# Patient Record
Sex: Female | Born: 1935 | Race: White | Hispanic: No | Smoking: Former smoker
Health system: Southern US, Community
[De-identification: ages and names within clinical notes are randomized; demographics above are authoritative.]

## PROBLEM LIST (undated history)

## (undated) DIAGNOSIS — I10 Essential (primary) hypertension: Secondary | ICD-10-CM

## (undated) DIAGNOSIS — K3184 Gastroparesis: Secondary | ICD-10-CM

## (undated) DIAGNOSIS — C50919 Malignant neoplasm of unspecified site of unspecified female breast: Secondary | ICD-10-CM

## (undated) DIAGNOSIS — J449 Chronic obstructive pulmonary disease, unspecified: Secondary | ICD-10-CM

## (undated) DIAGNOSIS — R7303 Prediabetes: Secondary | ICD-10-CM

## (undated) DIAGNOSIS — K219 Gastro-esophageal reflux disease without esophagitis: Secondary | ICD-10-CM

## (undated) DIAGNOSIS — G459 Transient cerebral ischemic attack, unspecified: Secondary | ICD-10-CM

## (undated) DIAGNOSIS — Z9889 Other specified postprocedural states: Secondary | ICD-10-CM

## (undated) DIAGNOSIS — N200 Calculus of kidney: Secondary | ICD-10-CM

## (undated) DIAGNOSIS — Z8719 Personal history of other diseases of the digestive system: Secondary | ICD-10-CM

## (undated) DIAGNOSIS — Z87898 Personal history of other specified conditions: Secondary | ICD-10-CM

## (undated) DIAGNOSIS — M1990 Unspecified osteoarthritis, unspecified site: Secondary | ICD-10-CM

## (undated) DIAGNOSIS — C801 Malignant (primary) neoplasm, unspecified: Secondary | ICD-10-CM

## (undated) DIAGNOSIS — M722 Plantar fascial fibromatosis: Secondary | ICD-10-CM

## (undated) DIAGNOSIS — R42 Dizziness and giddiness: Secondary | ICD-10-CM

## (undated) DIAGNOSIS — E7800 Pure hypercholesterolemia, unspecified: Secondary | ICD-10-CM

## (undated) DIAGNOSIS — K649 Unspecified hemorrhoids: Secondary | ICD-10-CM

## (undated) DIAGNOSIS — E559 Vitamin D deficiency, unspecified: Secondary | ICD-10-CM

## (undated) DIAGNOSIS — F419 Anxiety disorder, unspecified: Secondary | ICD-10-CM

## (undated) DIAGNOSIS — J439 Emphysema, unspecified: Secondary | ICD-10-CM

## (undated) DIAGNOSIS — R112 Nausea with vomiting, unspecified: Secondary | ICD-10-CM

## (undated) DIAGNOSIS — Z87442 Personal history of urinary calculi: Secondary | ICD-10-CM

## (undated) HISTORY — PX: COLONOSCOPY: SHX174

## (undated) HISTORY — PX: UPPER GI ENDOSCOPY: SHX6162

## (undated) HISTORY — DX: Calculus of kidney: N20.0

## (undated) HISTORY — DX: Other specified postprocedural states: Z98.890

## (undated) HISTORY — PX: ANTERIOR FUSION CERVICAL SPINE: SUR626

## (undated) HISTORY — DX: Anxiety disorder, unspecified: F41.9

## (undated) HISTORY — DX: Dizziness and giddiness: R42

## (undated) HISTORY — PX: BREAST SURGERY: SHX581

## (undated) HISTORY — DX: Unspecified hemorrhoids: K64.9

## (undated) HISTORY — PX: ABDOMINAL HYSTERECTOMY: SHX81

## (undated) HISTORY — DX: Prediabetes: R73.03

## (undated) HISTORY — DX: Essential (primary) hypertension: I10

## (undated) HISTORY — DX: Plantar fascial fibromatosis: M72.2

## (undated) HISTORY — PX: CHOLECYSTECTOMY: SHX55

## (undated) HISTORY — DX: Other specified postprocedural states: R11.2

## (undated) HISTORY — PX: HERNIA REPAIR: SHX51

## (undated) HISTORY — DX: Vitamin D deficiency, unspecified: E55.9

## (undated) HISTORY — DX: Pure hypercholesterolemia, unspecified: E78.00

## (undated) HISTORY — DX: Gastroparesis: K31.84

## (undated) HISTORY — DX: Personal history of other specified conditions: Z87.898

## (undated) HISTORY — PX: NISSEN FUNDOPLICATION: SHX2091

---

## 1974-03-19 HISTORY — PX: TOTAL ABDOMINAL HYSTERECTOMY W/ BILATERAL SALPINGOOPHORECTOMY: SHX83

## 1998-03-19 HISTORY — PX: POLYPECTOMY: SHX149

## 1999-03-20 HISTORY — PX: BREAST EXCISIONAL BIOPSY: SUR124

## 2004-03-19 DIAGNOSIS — C50919 Malignant neoplasm of unspecified site of unspecified female breast: Secondary | ICD-10-CM

## 2004-03-19 HISTORY — PX: BREAST EXCISIONAL BIOPSY: SUR124

## 2004-03-19 HISTORY — DX: Malignant neoplasm of unspecified site of unspecified female breast: C50.919

## 2004-03-28 ENCOUNTER — Ambulatory Visit: Payer: Self-pay | Admitting: Internal Medicine

## 2004-03-30 ENCOUNTER — Ambulatory Visit: Payer: Self-pay | Admitting: Internal Medicine

## 2004-04-11 ENCOUNTER — Ambulatory Visit: Payer: Self-pay | Admitting: Surgery

## 2004-04-17 ENCOUNTER — Ambulatory Visit: Payer: Self-pay | Admitting: Surgery

## 2004-04-27 ENCOUNTER — Ambulatory Visit: Payer: Self-pay | Admitting: Surgery

## 2004-05-15 ENCOUNTER — Ambulatory Visit: Payer: Self-pay | Admitting: Internal Medicine

## 2004-05-22 ENCOUNTER — Ambulatory Visit: Payer: Self-pay | Admitting: Internal Medicine

## 2004-06-17 ENCOUNTER — Ambulatory Visit: Payer: Self-pay | Admitting: Internal Medicine

## 2004-07-17 ENCOUNTER — Ambulatory Visit: Payer: Self-pay | Admitting: Internal Medicine

## 2004-08-17 ENCOUNTER — Ambulatory Visit: Payer: Self-pay | Admitting: Internal Medicine

## 2004-10-12 ENCOUNTER — Ambulatory Visit: Payer: Self-pay | Admitting: Internal Medicine

## 2004-10-17 ENCOUNTER — Ambulatory Visit: Payer: Self-pay | Admitting: Internal Medicine

## 2004-11-28 ENCOUNTER — Ambulatory Visit: Payer: Self-pay | Admitting: Internal Medicine

## 2004-12-21 ENCOUNTER — Ambulatory Visit: Payer: Self-pay | Admitting: Radiation Oncology

## 2005-02-14 ENCOUNTER — Ambulatory Visit: Payer: Self-pay | Admitting: Internal Medicine

## 2005-02-16 ENCOUNTER — Ambulatory Visit: Payer: Self-pay | Admitting: Internal Medicine

## 2005-04-02 ENCOUNTER — Ambulatory Visit: Payer: Self-pay | Admitting: Internal Medicine

## 2005-06-21 ENCOUNTER — Ambulatory Visit: Payer: Self-pay | Admitting: Radiation Oncology

## 2005-08-28 ENCOUNTER — Ambulatory Visit: Payer: Self-pay | Admitting: Internal Medicine

## 2005-09-16 ENCOUNTER — Ambulatory Visit: Payer: Self-pay | Admitting: Internal Medicine

## 2005-11-17 ENCOUNTER — Ambulatory Visit: Payer: Self-pay | Admitting: Internal Medicine

## 2006-02-27 ENCOUNTER — Ambulatory Visit: Payer: Self-pay | Admitting: Internal Medicine

## 2006-03-05 ENCOUNTER — Ambulatory Visit: Payer: Self-pay | Admitting: Internal Medicine

## 2006-03-07 ENCOUNTER — Other Ambulatory Visit: Payer: Self-pay

## 2006-03-07 ENCOUNTER — Emergency Department: Payer: Self-pay | Admitting: Emergency Medicine

## 2006-03-10 ENCOUNTER — Emergency Department: Payer: Self-pay | Admitting: General Practice

## 2006-03-10 ENCOUNTER — Other Ambulatory Visit: Payer: Self-pay

## 2006-03-14 ENCOUNTER — Inpatient Hospital Stay: Payer: Self-pay | Admitting: Surgery

## 2006-03-19 ENCOUNTER — Ambulatory Visit: Payer: Self-pay | Admitting: Internal Medicine

## 2006-04-03 ENCOUNTER — Ambulatory Visit: Payer: Self-pay | Admitting: Internal Medicine

## 2006-06-21 ENCOUNTER — Ambulatory Visit: Payer: Self-pay | Admitting: Radiation Oncology

## 2006-07-18 ENCOUNTER — Ambulatory Visit: Payer: Self-pay | Admitting: Radiation Oncology

## 2006-08-18 ENCOUNTER — Ambulatory Visit: Payer: Self-pay | Admitting: Internal Medicine

## 2006-09-09 ENCOUNTER — Ambulatory Visit: Payer: Self-pay | Admitting: Internal Medicine

## 2006-09-12 ENCOUNTER — Ambulatory Visit: Payer: Self-pay | Admitting: Internal Medicine

## 2006-09-17 ENCOUNTER — Ambulatory Visit: Payer: Self-pay | Admitting: Internal Medicine

## 2006-12-18 ENCOUNTER — Ambulatory Visit: Payer: Self-pay | Admitting: Internal Medicine

## 2006-12-18 ENCOUNTER — Ambulatory Visit: Payer: Self-pay | Admitting: Radiation Oncology

## 2007-01-18 ENCOUNTER — Ambulatory Visit: Payer: Self-pay | Admitting: Radiation Oncology

## 2007-02-17 ENCOUNTER — Ambulatory Visit: Payer: Self-pay | Admitting: Internal Medicine

## 2007-03-04 ENCOUNTER — Ambulatory Visit: Payer: Self-pay | Admitting: Internal Medicine

## 2007-03-20 ENCOUNTER — Ambulatory Visit: Payer: Self-pay | Admitting: Internal Medicine

## 2007-04-07 ENCOUNTER — Ambulatory Visit: Payer: Self-pay | Admitting: Internal Medicine

## 2007-06-15 ENCOUNTER — Other Ambulatory Visit: Payer: Self-pay

## 2007-06-16 ENCOUNTER — Observation Stay: Payer: Self-pay | Admitting: Internal Medicine

## 2007-08-18 ENCOUNTER — Ambulatory Visit: Payer: Self-pay | Admitting: Internal Medicine

## 2007-09-02 ENCOUNTER — Ambulatory Visit: Payer: Self-pay | Admitting: Internal Medicine

## 2007-09-17 ENCOUNTER — Ambulatory Visit: Payer: Self-pay | Admitting: Internal Medicine

## 2007-11-13 ENCOUNTER — Ambulatory Visit: Payer: Self-pay | Admitting: Internal Medicine

## 2007-11-18 ENCOUNTER — Ambulatory Visit: Payer: Self-pay | Admitting: Radiation Oncology

## 2007-11-19 ENCOUNTER — Ambulatory Visit: Payer: Self-pay | Admitting: Internal Medicine

## 2007-12-17 ENCOUNTER — Ambulatory Visit: Payer: Self-pay | Admitting: Internal Medicine

## 2008-02-17 ENCOUNTER — Ambulatory Visit: Payer: Self-pay | Admitting: Internal Medicine

## 2008-03-01 ENCOUNTER — Ambulatory Visit: Payer: Self-pay | Admitting: Internal Medicine

## 2008-03-19 ENCOUNTER — Ambulatory Visit: Payer: Self-pay | Admitting: Internal Medicine

## 2008-05-03 ENCOUNTER — Ambulatory Visit: Payer: Self-pay | Admitting: Surgery

## 2008-09-16 ENCOUNTER — Ambulatory Visit: Payer: Self-pay | Admitting: Internal Medicine

## 2008-09-24 ENCOUNTER — Ambulatory Visit: Payer: Self-pay | Admitting: Internal Medicine

## 2008-10-17 ENCOUNTER — Ambulatory Visit: Payer: Self-pay | Admitting: Internal Medicine

## 2008-11-17 ENCOUNTER — Ambulatory Visit: Payer: Self-pay | Admitting: Internal Medicine

## 2009-02-24 ENCOUNTER — Ambulatory Visit: Payer: Self-pay | Admitting: Gastroenterology

## 2009-04-11 ENCOUNTER — Ambulatory Visit: Payer: Self-pay | Admitting: Gastroenterology

## 2009-05-05 ENCOUNTER — Ambulatory Visit: Payer: Self-pay | Admitting: Internal Medicine

## 2009-07-17 ENCOUNTER — Ambulatory Visit: Payer: Self-pay | Admitting: Internal Medicine

## 2009-07-22 ENCOUNTER — Ambulatory Visit: Payer: Self-pay | Admitting: Internal Medicine

## 2009-08-17 ENCOUNTER — Ambulatory Visit: Payer: Self-pay | Admitting: Internal Medicine

## 2010-04-24 ENCOUNTER — Ambulatory Visit: Payer: Self-pay | Admitting: Internal Medicine

## 2010-05-08 ENCOUNTER — Ambulatory Visit: Payer: Self-pay | Admitting: Internal Medicine

## 2010-07-26 ENCOUNTER — Ambulatory Visit: Payer: Self-pay | Admitting: Internal Medicine

## 2010-07-27 LAB — CANCER ANTIGEN 27.29: CA 27.29: 15.9 U/mL (ref 0.0–38.6)

## 2010-08-18 ENCOUNTER — Ambulatory Visit: Payer: Self-pay | Admitting: Internal Medicine

## 2010-11-10 ENCOUNTER — Ambulatory Visit: Payer: Self-pay | Admitting: Otolaryngology

## 2011-03-10 ENCOUNTER — Emergency Department: Payer: Self-pay | Admitting: *Deleted

## 2011-05-10 ENCOUNTER — Ambulatory Visit: Payer: Self-pay | Admitting: Internal Medicine

## 2011-06-04 ENCOUNTER — Ambulatory Visit: Payer: Self-pay | Admitting: Family Medicine

## 2011-06-04 LAB — CREATININE, SERUM: EGFR (Non-African Amer.): >60

## 2011-09-12 ENCOUNTER — Emergency Department: Payer: Self-pay | Admitting: Internal Medicine

## 2011-09-12 LAB — COMPREHENSIVE METABOLIC PANEL
Albumin: 3.6 g/dL (ref 3.4–5.0)
Alkaline Phosphatase: 66 U/L (ref 50–136)
BUN: 19 mg/dL — ABNORMAL HIGH (ref 7–18)
Bilirubin,Total: 0.4 mg/dL (ref 0.2–1.0)
Chloride: 102 mmol/L (ref 98–107)
Co2: 30 mmol/L (ref 21–32)
Creatinine: 0.99 mg/dL (ref 0.60–1.30)
EGFR (African American): >60
EGFR (Non-African Amer.): 55 — ABNORMAL LOW
Glucose: 143 mg/dL — ABNORMAL HIGH (ref 65–99)
Potassium: 3.1 mmol/L — ABNORMAL LOW (ref 3.5–5.1)
SGOT(AST): 20 U/L (ref 15–37)
SGPT (ALT): 23 U/L
Total Protein: 6.5 g/dL (ref 6.4–8.2)

## 2011-09-12 LAB — CBC
HCT: 42 % (ref 35.0–47.0)
MCH: 30.7 pg (ref 26.0–34.0)
MCHC: 33.5 g/dL (ref 32.0–36.0)
MCV: 91 fL (ref 80–100)
Platelet: 230 10*3/uL (ref 150–440)
RBC: 4.59 10*6/uL (ref 3.80–5.20)
WBC: 8.9 10*3/uL (ref 3.6–11.0)

## 2011-09-12 LAB — URINALYSIS, COMPLETE
Bilirubin,UR: NEGATIVE
Ketone: NEGATIVE
Nitrite: NEGATIVE
Ph: 6 (ref 4.5–8.0)
Protein: NEGATIVE
WBC UR: 1 /HPF (ref 0–5)

## 2012-02-08 ENCOUNTER — Ambulatory Visit: Payer: Self-pay | Admitting: Family Medicine

## 2012-03-06 ENCOUNTER — Ambulatory Visit: Payer: Self-pay | Admitting: Family Medicine

## 2012-03-19 HISTORY — PX: HIATAL HERNIA REPAIR: SHX195

## 2012-04-01 ENCOUNTER — Ambulatory Visit: Payer: Self-pay | Admitting: Gastroenterology

## 2012-04-11 ENCOUNTER — Ambulatory Visit: Payer: Self-pay | Admitting: Gastroenterology

## 2012-04-11 HISTORY — PX: UPPER GASTROINTESTINAL ENDOSCOPY: SHX188

## 2012-04-30 ENCOUNTER — Ambulatory Visit: Payer: Self-pay | Admitting: Surgery

## 2012-05-08 ENCOUNTER — Inpatient Hospital Stay: Payer: Self-pay | Admitting: Surgery

## 2012-05-13 LAB — PLATELET COUNT: Platelet: 200 10*3/uL (ref 150–440)

## 2012-05-22 ENCOUNTER — Ambulatory Visit: Payer: Self-pay

## 2012-05-28 ENCOUNTER — Ambulatory Visit: Payer: Self-pay | Admitting: Internal Medicine

## 2012-06-13 ENCOUNTER — Ambulatory Visit: Payer: Self-pay | Admitting: Surgery

## 2012-07-17 ENCOUNTER — Ambulatory Visit: Payer: Self-pay | Admitting: Gastroenterology

## 2012-07-25 ENCOUNTER — Ambulatory Visit: Payer: Self-pay | Admitting: Gastroenterology

## 2012-07-25 HISTORY — PX: UPPER GASTROINTESTINAL ENDOSCOPY: SHX188

## 2012-08-13 ENCOUNTER — Ambulatory Visit: Payer: Self-pay | Admitting: Gastroenterology

## 2012-12-18 ENCOUNTER — Ambulatory Visit: Payer: Self-pay | Admitting: Gastroenterology

## 2012-12-19 LAB — PATHOLOGY REPORT

## 2013-06-01 ENCOUNTER — Ambulatory Visit: Payer: Self-pay | Admitting: Family Medicine

## 2014-03-02 HISTORY — PX: OTHER SURGICAL HISTORY: SHX169

## 2014-03-02 HISTORY — PX: EYE SURGERY: SHX253

## 2014-03-08 HISTORY — PX: OTHER SURGICAL HISTORY: SHX169

## 2014-03-29 HISTORY — PX: INTRAOCULAR LENS EXCHANGE: SHX1841

## 2014-05-17 ENCOUNTER — Other Ambulatory Visit: Payer: Self-pay | Admitting: Gastroenterology

## 2014-06-16 ENCOUNTER — Ambulatory Visit
Admit: 2014-06-16 | Disposition: A | Discharge status: Home / Self Care | Payer: Self-pay | Attending: Family Medicine | Admitting: Family Medicine

## 2014-07-09 NOTE — H&P (Signed)
PATIENT NAME:  Yolanda Taylor, Yolanda Taylor MR#:  [id number] DATE OF BIRTH:  18-Dec-1930  DATE OF ADMISSION:  07/25/2012  REASON FOR ADMISSION:  Gastric bezoar status post prolonged sedated procedure.   HISTORY OF PRESENT ILLNESS:  The patient is a 81 year old Caucasian female who came into the outpatient endoscopy unit for EGD. She was set up for this procedure due to the finding of what was possibly a gastric bezoar. She has a history of having an intrathoracic stomach with volvulus, putting her at risk for strangulation of the organ. She underwent recent fundoplication, that being in about mid February 2014, to correct this situation. At that time, she was also having multiple problems of dysphagia, gastroesophageal reflux and atypical chest pain. Since the fundoplication had been done, she seemed to be doing well for about a month, then began having multiple GI complaints. She was placed on some metoclopramide and some MiraLAX by her surgeon; however, she complained that this combination gave her diarrhea, and she stopped taking it.  She had an x-ray done at Dr. Thompson Caul office several weeks ago with the finding of possibly retaining gastric content. She was then sent back to see me. I instituted several measures at that time to include low residue diet, 6 smaller meals a day, MiraLAX on a regular basis, Phazyme, proton pump inhibitor. No carbonated beverages. A three-way abdominal film done about a week later showed apparently no evidence of the bezoar; however, her symptoms have increased and continued. Subsequently, she was brought in today to have her EGD. This procedure was done with general anesthesia to protect the airway due to the many passes of the instrument up and down the esophagus needed to clear the obstruction in the stomach. This was cleared after a prolonged procedure. She was also noted to have erosive esophagitis and esophageal dysmotility with marked tortuosity/presbyesophagus. She tolerated the  procedure well, currently is in postoperative recovery. She is being admitted for overnight observation.   PAST MEDICAL HISTORY: 1.  Hypertension.  2.  Hyperlipidemia.  3.  Gastroesophageal reflux.  4.  History of right breast cancer status post lumpectomy and radiation treatment followed by Dr. Ma Hillock and Dr. Tamala Julian.  5.  Anxiety.  6.  Vitamin D deficiency.  7.  History of vertigo.  8.  Chronic obstructive pulmonary disease.   PAST SURGICAL HISTORY:  1.  Cervical spinal fusion in 1969.  2.  Hysterectomy, bilateral oophorectomy in 1976.  3.  Colon polyp removal, 2000.  4.  Cholecystectomy in December 2007.  5.  Right breast cancer surgery.   OUTPATIENT MEDICATIONS: Include hydrochlorothiazide 25 mg a day, Advair Diskus 250/50 mcg 1 puff twice a day, Flonase 2 sprays once a day p.r.n., calcium with vitamin D, Ultram 50 mg 1 to 2 tablets every 4 to 6 hours p.r.n. for pain, Tylenol Extra Strength tablet p.r.n., buspirone 5 mg 1/2 to 1 tablet t.i.d. as needed, ondansetron 4 mg 3 times a day and rabeprazole 20 mg once a day before meals.   ALLERGIES: SHE IS ALLERGIC TO PAXIL, THIS MAKING HER "[place]."   PHYSICAL EXAMINATION: VITAL SIGNS: Temperature is 97, respirations 14, blood pressure 126/61, heart rate 82, pulse ox 100% on 2 liter nasal cannula.  HEENT: Normocephalic, atraumatic.  Eyes are anicteric.  HEART: Regular rate and rhythm.  LUNGS: Clear.  ABDOMEN: Soft. There is some minimal tenderness to palpation in the upper epigastric region. Bowel sounds positive, normoactive. There is no apparent organomegaly or masses felt.  ANORECTAL: Deferred.  EXTREMITIES:  No clubbing, cyanosis or edema.  NEUROLOGICAL: Cranial nerves II through XII grossly intact. Muscle strength bilaterally equal and symmetric. DTRs bilaterally equal and symmetric. She is in no acute distress.   ASSESSMENT:  1.  Gastric bezoar, now status post endoscopy and removal of this obstruction in the stomach. The procedure  was somewhat prolonged, and she is being admitted for observation overnight. We will continue her outpatient medications.  We will initiate Reglan with meals, and continue her on a clear liquid diet for now. She will be discharged on a full liquid diet to follow up with me this coming week.    ____________________________ Lollie Sails, MD mus:dmm D: 07/25/2012 18:31:01 ET T: 07/25/2012 19:08:42 ET JOB#: [id number]  cc: Lollie Sails, MD, <Dictator> Lollie Sails MD ELECTRONICALLY SIGNED 07/29/2012 13:03

## 2014-07-09 NOTE — Op Note (Signed)
PATIENT NAME:  Yolanda Taylor, GRUSSING MR#:  [id number] DATE OF BIRTH:  [birth year]  DATE OF PROCEDURE:  05/08/2012  PREOPERATIVE DIAGNOSIS: Hiatus hernia.   POSTOPERATIVE DIAGNOSIS: Hiatus hernia.   PROCEDURE: Laparoscopic hiatus hernia repair with fundoplication.   SURGEON: Rochel Brome, MD.   ANESTHESIA: General.   INDICATIONS: This 81 year old female has a history of epigastric bloating discomfort going on for several years, recently of increased severity. Recently had a barium swallow that demonstrated a hiatus hernia. CT demonstrated intrathoracic stomach. Upper endoscopy with evidence of torsion, and surgery was recommended for definitive treatment.   DESCRIPTION OF PROCEDURE: The patient was placed on the operating table in the supine position under general endotracheal anesthesia. The abdomen was prepared with ChloraPrep and draped in a sterile manner.   A supraumbilical incision was made and dissected down to the deep fascia which was grasped with laryngeal hook and elevated. A Veress needle was inserted, aspirated and irrigated with a saline solution. Next, the peritoneal cavity was inflated with carbon dioxide. The Veress needle was removed. The 10 mm cannula was inserted. The 10 mm 30-degree laparoscope was inserted to view the peritoneal cavity. The liver appeared smooth. It did have prominence of the left lobe extending far over to the left side. Brief survey demonstrated omentum and some small bowel and was otherwise unremarkable. The patient was placed in the reverse Trendelenburg position. A subxiphoid incision was made to insert an 11 mm cannula. Another incision was made in the left upper quadrant at the costal margin at the anterior axillary line to insert another 11 mm cannula. Additional cannulas were inserted in the right upper quadrant at the midclavicular line and one midway between that point and the camera site and another in the anterior axillary line for a total of 6  cannulas.   The fan retractor was placed through the subxiphoid cannula to retract the left lobe of the liver, exposing the diaphragmatic hiatus. The stomach was not yet seen at this time. There was omentum which protruded up into the hiatus, and traction was applied and some of the omentum was pulled down and exposed a portion of the stomach. Additional traction was applied. There was a small tear in the serosa of the stomach which was repaired with a 0 Surgidac figure-of-eight suture. Subsequently, with additional manipulation, the stomach was pulled down into the abdomen. Some dissection was carried out along the gastrohepatic ligament, and this was divided with the Harmonic scalpel and with some further manipulation with additional dissection, the stomach was eventually delivered down into the abdominal cavity. Circumferential dissection of the sac was done with the Harmonic scalpel, and a portion of the sac was excised and did not need to be sent for pathology. The right crus of the diaphragm was demonstrated and the peritoneum dissected away from it. Also, the left crus was identified and peritoneum dissected away from it, and also the sac was dissected circumferentially. With traction, the duodenum did come down below the diaphragm and extended several more centimeters to reach the stomach. Next, the repair was carried out with a row of 0 Surgidac figure-of-eight sutures beginning posteriorly and bringing this up to the esophagus, leaving just a small opening for satisfactory swallowing and this was sized with the 10 mm stone scoop. Next, the Gore-Tex Bio-A 7 x 10 cm mesh was selected which had a precut slot made for the esophagus. This slot was further cut to make it about 1 cm deeper and also corners were trimmed.  The mesh was inserted and was passed across the repair from right to left with the slot facing anterior and straddled this around the remaining hiatus and used a stapling instrument to attach  the mesh to the diaphragm bilaterally to secure it in place. The repair looked good. It is noted that during the course of the procedure there was minimal bleeding. A small amount of serosanguineous fluid was aspirated. Hemostasis subsequently appeared to be intact. Next, the fundoplication was carried out, passing a portion of fundus from left to right posterior to the esophagus and another portion brought adjacent to it, and the plication was carried out with interrupted 0 Surgidac simple sutures incorporating just a small amount of the seromuscular coat of the esophagus into the plication, and the plication appeared to be satisfactorily floppy. Following this, seeing hemostasis was intact, the laparoscopic instruments were removed. There was some scant bleeding from the incision in the right midclavicular line. This site was infiltrated with 0.5% Sensorcaine with epinephrine. Also, all incisions were injected in the subcutaneous tissues. Carbon dioxide was allowed to escape from the peritoneal cavity. Skin incisions were closed with interrupted 4-0 chromic subcuticular sutures and Dermabond. The patient tolerated surgery satisfactorily and then was carried to the recovery room for postoperative care.    ____________________________ J. Rochel Brome, MD jws:gb D: 05/08/2012 17:52:59 ET T: 05/09/2012 06:05:00 ET JOB#: [id number]  cc: Loreli Dollar, MD, <Dictator> Loreli Dollar MD ELECTRONICALLY SIGNED 05/10/2012 9:57

## 2014-07-09 NOTE — Discharge Summary (Signed)
PATIENT NAME:  Yolanda Taylor, Yolanda Taylor MR#:  [id number] DATE OF BIRTH:  Nov 02, 1930  DATE OF ADMISSION:  05/08/2012 DATE OF DISCHARGE:  05/13/2012  HISTORY OF PRESENT ILLNESS: This 81 year old female was admitted electively for laparoscopic hiatus hernia repair with fundoplication She had a history of epigastric bloating discomfort going on for years, recently increased in severity. Recently had a CT scan which demonstrated intrathoracic stomach. Upper endoscopy with evidence of torsion of the stomach with large hiatus hernia.   Other past medical history includes hypertension, COPD, anxiety, history of right breast cancer.   She was brought in through the outpatient department and had a laparoscopic hiatus hernia repair with Gore-Tex Bio-A mesh and also fundoplication.   Postoperatively, she was kept in the hospital for a period of observation. Did have some problems with nasal congestion and also just some difficulties with mild hypoxemia. Was treated with bronchodilators and Afrin spray. Had some mild degree of dysphagia. Gradually improved and was subsequently discharged.   DISCHARGE INSTRUCTIONS: Include followup in the office and will continue her usual medicines.   FINAL DIAGNOSIS: Hiatus hernia with chronic gastroesophageal reflux.   OPERATION: Laparoscopic hiatus hernia repair with fundoplication.   ____________________________ Lenna Sciara. Rochel Brome, MD jws:jm D: 05/23/2012 11:24:37 ET T: 05/23/2012 13:47:41 ET JOB#: [id number]  cc: Loreli Dollar, MD, <Dictator> Loreli Dollar MD ELECTRONICALLY SIGNED 05/24/2012 14:47

## 2014-07-09 NOTE — Consult Note (Signed)
Chief Complaint:  Subjective/Chief Complaint Please see admission h+p #[id number].  Patient admitted from outpatietn endoscopy for observation after EGD for removal of gastric bezoar.  Prolonged sedation was required to remove the bezoar, basically compacted food/vegetable material. Patietn did well after proceedure, will admit for observation, please see admission H+P.   Brief Assessment:  Cardiac Regular   Respiratory clear BS   Gastrointestinal details normal Soft  Nondistended  No masses palpable  Bowel sounds normal  No rebound tenderness  No gaurding  mild upper epigastric discojfort to palpation.   Electronic Signatures: Loistine Simas (MD)  (Signed [phone] 18:34)  Authored: Chief Complaint, Brief Assessment   Last Updated: 09-May-14 18:34 by Loistine Simas (MD)

## 2014-07-09 NOTE — Consult Note (Signed)
Chief Complaint:  Subjective/Chief Complaint Patietn requesting to go home.  Patietn reassessed, doing well after more fully awakening form sedation.  Patietn to go home with family assistance, to call if there is any nausea vomiting fever or abdominal pain.  to continue bid ppi (protonix) and reglan 5 mg 30 min ac and at bedtime.  (prescription given).  Patient to call my office on monday am to arrange appointment to see me this week.  Will d/c home.   VITAL SIGNS/ANCILLARY NOTES: **Vital Signs.:   09-May-14 19:21  Vital Signs Type Admission  Temperature Temperature (F) 97.8  Celsius 36.5  Temperature Source oral  Pulse Pulse 77  Respirations Respirations 18  Systolic BP Systolic BP 474  Diastolic BP (mmHg) Diastolic BP (mmHg) 63  Mean BP 88  Pulse Ox % Pulse Ox % 93  Pulse Ox Activity Level  At rest  Oxygen Delivery Room Air/ 21 %   Brief Assessment:  Cardiac Regular   Respiratory clear BS   Gastrointestinal details normal Soft  Nontender  Nondistended  No masses palpable  Bowel sounds normal  No rebound tenderness  No gaurding   Electronic Signatures: Loistine Simas (MD)  (Signed 09-May-14 19:39)  Authored: Chief Complaint, VITAL SIGNS/ANCILLARY NOTES, Brief Assessment   Last Updated: 09-May-14 19:39 by Loistine Simas (MD)

## 2014-09-17 HISTORY — PX: HIP FRACTURE SURGERY: SHX118

## 2014-10-10 ENCOUNTER — Emergency Department: Payer: Medicare Other

## 2014-10-10 ENCOUNTER — Encounter: Payer: Self-pay | Admitting: Emergency Medicine

## 2014-10-10 ENCOUNTER — Other Ambulatory Visit: Payer: Self-pay | Admitting: Emergency Medicine

## 2014-10-10 ENCOUNTER — Inpatient Hospital Stay
Admission: EM | Admit: 2014-10-10 | Discharge: 2014-10-14 | DRG: 481 | Disposition: A | Discharge status: Skilled Nursing Facility | Payer: Medicare Other | Attending: Internal Medicine | Admitting: Internal Medicine

## 2014-10-10 DIAGNOSIS — M25552 Pain in left hip: Secondary | ICD-10-CM

## 2014-10-10 DIAGNOSIS — J449 Chronic obstructive pulmonary disease, unspecified: Secondary | ICD-10-CM | POA: Diagnosis present

## 2014-10-10 DIAGNOSIS — Z79899 Other long term (current) drug therapy: Secondary | ICD-10-CM

## 2014-10-10 DIAGNOSIS — S7222XA Displaced subtrochanteric fracture of left femur, initial encounter for closed fracture: Secondary | ICD-10-CM | POA: Diagnosis not present

## 2014-10-10 DIAGNOSIS — Z7982 Long term (current) use of aspirin: Secondary | ICD-10-CM

## 2014-10-10 DIAGNOSIS — D62 Acute posthemorrhagic anemia: Secondary | ICD-10-CM | POA: Diagnosis not present

## 2014-10-10 DIAGNOSIS — Z853 Personal history of malignant neoplasm of breast: Secondary | ICD-10-CM

## 2014-10-10 DIAGNOSIS — S72413A Displaced unspecified condyle fracture of lower end of unspecified femur, initial encounter for closed fracture: Secondary | ICD-10-CM | POA: Diagnosis present

## 2014-10-10 DIAGNOSIS — S7290XA Unspecified fracture of unspecified femur, initial encounter for closed fracture: Secondary | ICD-10-CM

## 2014-10-10 DIAGNOSIS — I10 Essential (primary) hypertension: Secondary | ICD-10-CM | POA: Diagnosis present

## 2014-10-10 DIAGNOSIS — S7292XA Unspecified fracture of left femur, initial encounter for closed fracture: Secondary | ICD-10-CM

## 2014-10-10 DIAGNOSIS — T148XXA Other injury of unspecified body region, initial encounter: Secondary | ICD-10-CM

## 2014-10-10 DIAGNOSIS — R509 Fever, unspecified: Secondary | ICD-10-CM

## 2014-10-10 DIAGNOSIS — R5082 Postprocedural fever: Secondary | ICD-10-CM | POA: Diagnosis not present

## 2014-10-10 DIAGNOSIS — Y92002 Bathroom of unspecified non-institutional (private) residence single-family (private) house as the place of occurrence of the external cause: Secondary | ICD-10-CM

## 2014-10-10 DIAGNOSIS — W010XXA Fall on same level from slipping, tripping and stumbling without subsequent striking against object, initial encounter: Secondary | ICD-10-CM | POA: Diagnosis present

## 2014-10-10 HISTORY — DX: Malignant (primary) neoplasm, unspecified: C80.1

## 2014-10-10 HISTORY — DX: Essential (primary) hypertension: I10

## 2014-10-10 HISTORY — DX: Chronic obstructive pulmonary disease, unspecified: J44.9

## 2014-10-10 LAB — CBC WITH DIFFERENTIAL/PLATELET
BASOS PCT: 0 %
Basophils Absolute: 0 10*3/uL (ref 0–0.1)
Eosinophils Absolute: 0 10*3/uL (ref 0–0.7)
Eosinophils Relative: 0 %
HCT: 41.8 % (ref 35.0–47.0)
Hemoglobin: 13.8 g/dL (ref 12.0–16.0)
Lymphocytes Relative: 6 %
Lymphs Abs: 0.9 10*3/uL — ABNORMAL LOW (ref 1.0–3.6)
MCH: 30.5 pg (ref 26.0–34.0)
MCHC: 33 g/dL (ref 32.0–36.0)
MCV: 92.4 fL (ref 80.0–100.0)
MONOS PCT: 5 %
Monocytes Absolute: 0.7 10*3/uL (ref 0.2–0.9)
NEUTROS ABS: 13.5 10*3/uL — AB (ref 1.4–6.5)
NEUTROS PCT: 89 %
Platelets: 231 10*3/uL (ref 150–440)
RBC: 4.52 MIL/uL (ref 3.80–5.20)
RDW: 13 % (ref 11.5–14.5)
WBC: 15.2 10*3/uL — ABNORMAL HIGH (ref 3.6–11.0)

## 2014-10-10 LAB — BASIC METABOLIC PANEL
ANION GAP: 9 (ref 5–15)
BUN: 18 mg/dL (ref 6–20)
CO2: 29 mmol/L (ref 22–32)
Calcium: 9.4 mg/dL (ref 8.9–10.3)
Chloride: 102 mmol/L (ref 101–111)
Creatinine, Ser: 0.75 mg/dL (ref 0.44–1.00)
GFR calc Af Amer: >60 mL/min (ref >60)
GFR calc non Af Amer: >60 mL/min (ref >60)
Glucose, Bld: 123 mg/dL — ABNORMAL HIGH (ref 65–99)
POTASSIUM: 3.5 mmol/L (ref 3.5–5.1)
SODIUM: 140 mmol/L (ref 135–145)

## 2014-10-10 LAB — URINALYSIS COMPLETE WITH MICROSCOPIC (ARMC ONLY)
Bacteria, UA: NONE SEEN
Bilirubin Urine: NEGATIVE
Glucose, UA: NEGATIVE mg/dL
Hgb urine dipstick: NEGATIVE
KETONES UR: NEGATIVE mg/dL
LEUKOCYTES UA: NEGATIVE
NITRITE: NEGATIVE
PROTEIN: NEGATIVE mg/dL
RBC / HPF: NONE SEEN RBC/hpf (ref 0–5)
Specific Gravity, Urine: 1.016 (ref 1.005–1.030)
WBC UA: NONE SEEN WBC/hpf (ref 0–5)
pH: 6 (ref 5.0–8.0)

## 2014-10-10 LAB — APTT: aPTT: 25 seconds (ref 24–36)

## 2014-10-10 LAB — PROTIME-INR
INR: 0.92
PROTHROMBIN TIME: 12.6 s (ref 11.4–15.0)

## 2014-10-10 LAB — ALBUMIN: ALBUMIN: 4 g/dL (ref 3.5–5.0)

## 2014-10-10 MED ORDER — HYDROMORPHONE HCL 1 MG/ML IJ SOLN
INTRAMUSCULAR | Status: AC
  Administered 2014-10-10: 1 mg via INTRAVENOUS
  Filled 2014-10-10: qty 1

## 2014-10-10 MED ORDER — HYDROCODONE-ACETAMINOPHEN 5-325 MG PO TABS
1.0000 | ORAL_TABLET | Freq: Four times a day (QID) | ORAL | Status: DC | PRN
  Administered 2014-10-11: 1 via ORAL
  Filled 2014-10-10: qty 1

## 2014-10-10 MED ORDER — ONDANSETRON HCL 4 MG/2ML IJ SOLN
4.0000 mg | Freq: Once | INTRAMUSCULAR | Status: AC
  Administered 2014-10-10: 4 mg via INTRAVENOUS

## 2014-10-10 MED ORDER — MORPHINE SULFATE 2 MG/ML IJ SOLN: 1.0000 mg | INTRAMUSCULAR | Status: DC | PRN

## 2014-10-10 MED ORDER — HYDROMORPHONE HCL 1 MG/ML IJ SOLN
1.0000 mg | Freq: Once | INTRAMUSCULAR | Status: AC
  Administered 2014-10-10: 1 mg via INTRAVENOUS

## 2014-10-10 MED ORDER — HYDROMORPHONE HCL 1 MG/ML IJ SOLN
INTRAMUSCULAR | Status: AC
  Filled 2014-10-10: qty 1

## 2014-10-10 MED ORDER — SENNA 8.6 MG PO TABS
1.0000 | ORAL_TABLET | Freq: Two times a day (BID) | ORAL | Status: DC
  Administered 2014-10-11: 8.6 mg via ORAL
  Filled 2014-10-10: qty 1

## 2014-10-10 MED ORDER — CLINDAMYCIN PHOSPHATE 600 MG/50ML IV SOLN
600.0000 mg | Freq: Once | INTRAVENOUS | Status: AC
  Administered 2014-10-11: 600 mg via INTRAVENOUS
  Filled 2014-10-10 (×2): qty 50

## 2014-10-10 MED ORDER — ZOLPIDEM TARTRATE 5 MG PO TABS: 5.0000 mg | ORAL_TABLET | Freq: Every evening | ORAL | Status: DC | PRN

## 2014-10-10 MED ORDER — ONDANSETRON HCL 4 MG/2ML IJ SOLN
INTRAMUSCULAR | Status: AC
  Administered 2014-10-10: 4 mg via INTRAVENOUS
  Filled 2014-10-10: qty 2

## 2014-10-10 MED ORDER — HYDROMORPHONE HCL 1 MG/ML IJ SOLN
0.5000 mg | Freq: Once | INTRAMUSCULAR | Status: AC
  Administered 2014-10-10: 0.5 mg via INTRAVENOUS
  Filled 2014-10-10: qty 1

## 2014-10-10 MED ORDER — CEFAZOLIN SODIUM-DEXTROSE 2-3 GM-% IV SOLR
2.0000 g | Freq: Once | INTRAVENOUS | Status: AC
  Administered 2014-10-11: 2 g via INTRAVENOUS
  Filled 2014-10-10 (×2): qty 50

## 2014-10-10 MED ORDER — HYDROMORPHONE HCL 1 MG/ML IJ SOLN
0.5000 mg | Freq: Once | INTRAMUSCULAR | Status: AC
  Administered 2014-10-10: 0.5 mg via INTRAVENOUS

## 2014-10-10 MED ORDER — METHOCARBAMOL 500 MG PO TABS
500.0000 mg | ORAL_TABLET | Freq: Four times a day (QID) | ORAL | Status: DC | PRN
  Administered 2014-10-11: 500 mg via ORAL
  Filled 2014-10-10: qty 1

## 2014-10-10 MED ORDER — DEXTROSE 5 % IV SOLN
500.0000 mg | Freq: Four times a day (QID) | INTRAVENOUS | Status: DC | PRN
  Filled 2014-10-10: qty 5

## 2014-10-10 NOTE — ED Provider Notes (Signed)
[facility] Emergency Department Provider Note  ____________________________________________  Time seen: 8:35 PM on arrival by EMS  I have reviewed the triage vital signs and the nursing notes.   HISTORY  Chief Complaint Fall    HPI Yolanda Taylor is a 81 y.o. female who tripped over her bathroom at this evening and fell on the floor, she had sudden severe left hip pain and was unable to stand up. Turgor 4 hours to recheck telephone and when she called her family who then called EMS. She complains of left hip pain and cramping in the left thigh. No numbness tingling. No head injury or syncope chest pain shortness of breath vomiting abdominal pain.     Past Medical History  Diagnosis Date  . Cancer   . Hypertension   . COPD (chronic obstructive pulmonary disease)     There are no active problems to display for this patient.   Past Surgical History  Procedure Laterality Date  . Breast surgery    . Cholecystectomy    . Abdominal hysterectomy    . Eye surgery    . Hernia repair      No current outpatient prescriptions on file.  Allergies Review of patient's allergies indicates no known allergies.  History reviewed. No pertinent family history.  Social History History  Substance Use Topics  . Smoking status: Never Smoker   . Smokeless tobacco: Not on file  . Alcohol Use: No    Review of Systems  Constitutional: No fever or chills. No weight changes Eyes:No blurry vision or double vision.  ENT: No sore throat. Cardiovascular: No chest pain. Respiratory: No dyspnea or cough. Gastrointestinal: Negative for abdominal pain, vomiting and diarrhea.  No BRBPR or melena. Genitourinary: Negative for dysuria, urinary retention, bloody urine, or difficulty urinating. Musculoskeletal: Left hip pain as above. Skin: Negative for rash. Neurological: Negative for headaches, focal weakness or numbness. Psychiatric:No anxiety or depression.    Endocrine:No hot/cold intolerance, changes in energy, or sleep difficulty.  10-point ROS otherwise negative.  ____________________________________________   PHYSICAL EXAM:  VITAL SIGNS: ED Triage Vitals  Enc Vitals Group     BP 10/10/14 2038 153/73 mmHg     Pulse Rate 10/10/14 2038 99     Resp --      Temp --      Temp src --      SpO2 10/10/14 2038 96 %     Weight 10/10/14 2038 174 lb 8 oz (79.153 kg)     Height 10/10/14 2038 5\' 5"  (1.651 m)     Head Cir --      Peak Flow --      Pain Score --      Pain Loc --      Pain Edu? --      Excl. in [place]? --      Constitutional: Alert and oriented. Moderate distress due to pain. Eyes: No scleral icterus. No conjunctival pallor. PERRL. EOMI ENT   Head: Normocephalic and atraumatic.   Nose: No congestion/rhinnorhea. No septal hematoma   Mouth/Throat: MMM, no pharyngeal erythema. No peritonsillar mass. No uvula shift.   Neck: No stridor. No SubQ emphysema. No meningismus. Hematological/Lymphatic/Immunilogical: No cervical lymphadenopathy. Cardiovascular: RRR. Normal and symmetric distal pulses are present in all extremities. No murmurs, rubs, or gallops. Respiratory: Normal respiratory effort without tachypnea nor retractions. Breath sounds are clear and equal bilaterally. No wheezes/rales/rhonchi. Gastrointestinal: Soft and nontender. No distention. There is no CVA tenderness.  No rebound, rigidity, or  guarding. Genitourinary: deferred Musculoskeletal: Left proximal hip swelling and tenderness over the greater trochanter and the anterior thigh. No edema. Skeletal exam otherwise unremarkable apart from the left femur area. Knee and ankle are unremarkable.  Neurologic:   Normal speech and language.  CN 2-10 normal. Motor grossly intact. . Left lower extremity motor and sensation intact No gross focal neurologic deficits are appreciated.  Skin:  Skin is warm, dry and intact. No rash noted.  No petechiae, purpura, or  bullae. Psychiatric: Mood and affect are normal. Speech and behavior are normal. Patient exhibits appropriate insight and judgment.  ____________________________________________    LABS (pertinent positives/negatives) (all labs ordered are listed, but only abnormal results are displayed) Labs Reviewed  BASIC METABOLIC PANEL - Abnormal; Notable for the following:    Glucose, Bld 123 (*)    All other components within normal limits  CBC WITH DIFFERENTIAL/PLATELET - Abnormal; Notable for the following:    WBC 15.2 (*)    Neutro Abs 13.5 (*)    Lymphs Abs 0.9 (*)    All other components within normal limits   ____________________________________________   EKG    ____________________________________________    RADIOLOGY  X-ray left hip and chest x-ray pending  ____________________________________________   PROCEDURES  ____________________________________________   INITIAL IMPRESSION / ASSESSMENT AND PLAN / ED COURSE  Pertinent labs & imaging results that were available during my care of the patient were reviewed by me and considered in my medical decision making (see chart for details).  Patient presents with clinical concern for left hip fracture. We'll evaluate with x-ray and check labs. Patient was given fentanyl prior to arrival by EMS, we'll give 1 mg of Dilaudid due to continued severe pain. ----------------------------------------- 9:50 PM on 10/10/2014 -----------------------------------------  Still awaiting x-ray results. Care of the patient was signed out to Dr. Jacqualine Code pending workup to determine disposition  ____________________________________________   FINAL CLINICAL IMPRESSION(S) / ED DIAGNOSES  Final diagnoses:  Hip pain, acute, left      Carrie Mew, MD 10/10/14 2151

## 2014-10-10 NOTE — ED Provider Notes (Signed)
X-ray demonstrates acute left proximal femur fracture.  Reevaluation of the patient, she has strong dorsalis pedis pulses and normal neurologic exam distal in the left foot. She is still in a fair amount of pain, we will give additional Dilaudid. I spoke with Dr. Sabra Heck of the orthopedic service and we will admit the patient to the medical service, Dr. Sabra Heck will see her in consult and likely perform surgery tomorrow afternoon.  Admit Condition improved Impression Right femur fracture acute  Delman Kitten, MD 10/10/14 [phone]

## 2014-10-10 NOTE — ED Notes (Signed)
Pt arrived via EMS from home with a fall. Pt tripped over a bathroom rug and laid on the floor for roughly 4 hours until she called her daughter. Pt complains of left hip pain and cramps.

## 2014-10-10 NOTE — ED Notes (Signed)
Patient transported to X-ray 

## 2014-10-10 NOTE — ED Notes (Signed)
Report from shannin rn. Pt placed on oxygen at 2lpm via [place] for ra pox of 86%. Pt rebounds up to 96% on oxygen. Pt states pain is improved but continues. Plan of care discussed with pt and daughter.  Call bell at side.

## 2014-10-11 ENCOUNTER — Inpatient Hospital Stay: Payer: Medicare Other | Admitting: Anesthesiology

## 2014-10-11 ENCOUNTER — Encounter
Admission: EM | Disposition: A | Discharge status: Skilled Nursing Facility | Payer: Self-pay | Source: Home / Self Care | Attending: Internal Medicine

## 2014-10-11 ENCOUNTER — Inpatient Hospital Stay: Payer: Medicare Other

## 2014-10-11 DIAGNOSIS — S72413A Displaced unspecified condyle fracture of lower end of unspecified femur, initial encounter for closed fracture: Secondary | ICD-10-CM | POA: Diagnosis present

## 2014-10-11 DIAGNOSIS — J449 Chronic obstructive pulmonary disease, unspecified: Secondary | ICD-10-CM | POA: Diagnosis present

## 2014-10-11 DIAGNOSIS — Z7982 Long term (current) use of aspirin: Secondary | ICD-10-CM | POA: Diagnosis not present

## 2014-10-11 DIAGNOSIS — Z853 Personal history of malignant neoplasm of breast: Secondary | ICD-10-CM | POA: Diagnosis not present

## 2014-10-11 DIAGNOSIS — D62 Acute posthemorrhagic anemia: Secondary | ICD-10-CM | POA: Diagnosis not present

## 2014-10-11 DIAGNOSIS — R5082 Postprocedural fever: Secondary | ICD-10-CM | POA: Diagnosis not present

## 2014-10-11 DIAGNOSIS — W010XXA Fall on same level from slipping, tripping and stumbling without subsequent striking against object, initial encounter: Secondary | ICD-10-CM | POA: Diagnosis present

## 2014-10-11 DIAGNOSIS — I10 Essential (primary) hypertension: Secondary | ICD-10-CM | POA: Diagnosis present

## 2014-10-11 DIAGNOSIS — Z79899 Other long term (current) drug therapy: Secondary | ICD-10-CM | POA: Diagnosis not present

## 2014-10-11 DIAGNOSIS — Y92002 Bathroom of unspecified non-institutional (private) residence single-family (private) house as the place of occurrence of the external cause: Secondary | ICD-10-CM | POA: Diagnosis not present

## 2014-10-11 DIAGNOSIS — S7222XA Displaced subtrochanteric fracture of left femur, initial encounter for closed fracture: Secondary | ICD-10-CM | POA: Diagnosis present

## 2014-10-11 HISTORY — PX: INTRAMEDULLARY (IM) NAIL INTERTROCHANTERIC: SHX5875

## 2014-10-11 LAB — CBC
HEMATOCRIT: 37.3 % (ref 35.0–47.0)
HEMOGLOBIN: 12.7 g/dL (ref 12.0–16.0)
MCH: 31.3 pg (ref 26.0–34.0)
MCHC: 34 g/dL (ref 32.0–36.0)
MCV: 92.1 fL (ref 80.0–100.0)
PLATELETS: 203 10*3/uL (ref 150–440)
RBC: 4.05 MIL/uL (ref 3.80–5.20)
RDW: 13.2 % (ref 11.5–14.5)
WBC: 13 10*3/uL — AB (ref 3.6–11.0)

## 2014-10-11 LAB — BASIC METABOLIC PANEL
Anion gap: 8 (ref 5–15)
BUN: 15 mg/dL (ref 6–20)
CALCIUM: 8.6 mg/dL — AB (ref 8.9–10.3)
CO2: 28 mmol/L (ref 22–32)
CREATININE: 0.67 mg/dL (ref 0.44–1.00)
Chloride: 104 mmol/L (ref 101–111)
GFR calc non Af Amer: >60 mL/min (ref >60)
Glucose, Bld: 139 mg/dL — ABNORMAL HIGH (ref 65–99)
Potassium: 4 mmol/L (ref 3.5–5.1)
SODIUM: 140 mmol/L (ref 135–145)

## 2014-10-11 LAB — TYPE AND SCREEN
ABO/RH(D): A POS
ANTIBODY SCREEN: NEGATIVE

## 2014-10-11 LAB — PROTIME-INR
INR: 0.99
Prothrombin Time: 13.3 seconds (ref 11.4–15.0)

## 2014-10-11 LAB — SURGICAL PCR SCREEN
MRSA, PCR: NEGATIVE
STAPHYLOCOCCUS AUREUS: NEGATIVE

## 2014-10-11 SURGERY — FIXATION, FRACTURE, INTERTROCHANTERIC, WITH INTRAMEDULLARY ROD
Anesthesia: Spinal | Laterality: Left

## 2014-10-11 MED ORDER — ONDANSETRON HCL 4 MG/2ML IJ SOLN: 4.0000 mg | Freq: Once | INTRAMUSCULAR | Status: DC | PRN

## 2014-10-11 MED ORDER — ACETAMINOPHEN 500 MG PO TABS
1000.0000 mg | ORAL_TABLET | Freq: Four times a day (QID) | ORAL | Status: AC
  Administered 2014-10-12 (×3): 1000 mg via ORAL
  Filled 2014-10-11 (×3): qty 2

## 2014-10-11 MED ORDER — CLINDAMYCIN PHOSPHATE 600 MG/50ML IV SOLN
600.0000 mg | Freq: Three times a day (TID) | INTRAVENOUS | Status: AC
Start: 2014-10-11 — End: 2014-10-12
  Administered 2014-10-11 – 2014-10-12 (×3): 600 mg via INTRAVENOUS
  Filled 2014-10-11 (×3): qty 50

## 2014-10-11 MED ORDER — CEFAZOLIN SODIUM-DEXTROSE 2-3 GM-% IV SOLR
2.0000 g | Freq: Three times a day (TID) | INTRAVENOUS | Status: AC
  Administered 2014-10-11 – 2014-10-12 (×3): 2 g via INTRAVENOUS
  Filled 2014-10-11 (×3): qty 50

## 2014-10-11 MED ORDER — PROPOFOL 10 MG/ML IV BOLUS
INTRAVENOUS | Status: DC | PRN
  Administered 2014-10-11 (×2): 20 mg via INTRAVENOUS

## 2014-10-11 MED ORDER — BUPIVACAINE-EPINEPHRINE (PF) 0.25% -1:200000 IJ SOLN
INTRAMUSCULAR | Status: AC
  Filled 2014-10-11: qty 30

## 2014-10-11 MED ORDER — MORPHINE SULFATE 2 MG/ML IJ SOLN
2.0000 mg | INTRAMUSCULAR | Status: DC | PRN
  Administered 2014-10-11 (×4): 2 mg via INTRAVENOUS
  Filled 2014-10-11 (×4): qty 1

## 2014-10-11 MED ORDER — DIAZEPAM 5 MG PO TABS
5.0000 mg | ORAL_TABLET | Freq: Three times a day (TID) | ORAL | Status: DC | PRN
  Administered 2014-10-11: 5 mg via ORAL
  Filled 2014-10-11: qty 1

## 2014-10-11 MED ORDER — DOCUSATE SODIUM 100 MG PO CAPS: 100.0000 mg | ORAL_CAPSULE | Freq: Two times a day (BID) | ORAL | Status: DC

## 2014-10-11 MED ORDER — SODIUM CHLORIDE 0.9 % IV SOLN
INTRAVENOUS | Status: DC
  Administered 2014-10-11: 75 mL/h via INTRAVENOUS
  Administered 2014-10-11: 10:00:00 via INTRAVENOUS

## 2014-10-11 MED ORDER — PANTOPRAZOLE SODIUM 40 MG PO TBEC: 40.0000 mg | DELAYED_RELEASE_TABLET | Freq: Every day | ORAL | Status: DC

## 2014-10-11 MED ORDER — PROPOFOL INFUSION 10 MG/ML OPTIME
INTRAVENOUS | Status: DC | PRN
  Administered 2014-10-11: 40 ug/kg/min via INTRAVENOUS

## 2014-10-11 MED ORDER — MENTHOL 3 MG MT LOZG
1.0000 | LOZENGE | OROMUCOSAL | Status: DC | PRN
  Filled 2014-10-11: qty 9

## 2014-10-11 MED ORDER — PHENYLEPHRINE HCL 10 MG/ML IJ SOLN
INTRAMUSCULAR | Status: DC | PRN
  Administered 2014-10-11 (×2): 100 ug via INTRAVENOUS
  Administered 2014-10-11: 200 ug via INTRAVENOUS
  Administered 2014-10-11 (×2): 100 ug via INTRAVENOUS
  Administered 2014-10-11: 200 ug via INTRAVENOUS

## 2014-10-11 MED ORDER — ENOXAPARIN SODIUM 40 MG/0.4ML ~~LOC~~ SOLN
40.0000 mg | SUBCUTANEOUS | Status: DC
  Administered 2014-10-12 – 2014-10-14 (×3): 40 mg via SUBCUTANEOUS
  Filled 2014-10-11 (×3): qty 0.4

## 2014-10-11 MED ORDER — KETAMINE HCL 50 MG/ML IJ SOLN
INTRAMUSCULAR | Status: DC | PRN
  Administered 2014-10-11 (×2): 25 mg via INTRAMUSCULAR

## 2014-10-11 MED ORDER — BISACODYL 10 MG RE SUPP
10.0000 mg | Freq: Every day | RECTAL | Status: DC | PRN
  Administered 2014-10-13: 10 mg via RECTAL
  Filled 2014-10-11: qty 1

## 2014-10-11 MED ORDER — FENTANYL CITRATE (PF) 100 MCG/2ML IJ SOLN
INTRAMUSCULAR | Status: AC
  Administered 2014-10-11: 25 ug via INTRAVENOUS
  Filled 2014-10-11: qty 2

## 2014-10-11 MED ORDER — EPHEDRINE SULFATE 50 MG/ML IJ SOLN
INTRAMUSCULAR | Status: DC | PRN
  Administered 2014-10-11 (×2): 10 mg via INTRAVENOUS

## 2014-10-11 MED ORDER — ALBUTEROL SULFATE HFA 108 (90 BASE) MCG/ACT IN AERS: 2.0000 | INHALATION_SPRAY | RESPIRATORY_TRACT | Status: DC | PRN

## 2014-10-11 MED ORDER — METOCLOPRAMIDE HCL 5 MG/ML IJ SOLN: 5.0000 mg | Freq: Three times a day (TID) | INTRAMUSCULAR | Status: DC | PRN

## 2014-10-11 MED ORDER — BISACODYL 5 MG PO TBEC: 5.0000 mg | DELAYED_RELEASE_TABLET | Freq: Every day | ORAL | Status: DC | PRN

## 2014-10-11 MED ORDER — PHENOL 1.4 % MT LIQD
1.0000 | OROMUCOSAL | Status: DC | PRN
  Filled 2014-10-11: qty 177

## 2014-10-11 MED ORDER — FLUTICASONE PROPIONATE 50 MCG/ACT NA SUSP
2.0000 | Freq: Every day | NASAL | Status: DC
  Administered 2014-10-11: 2 via NASAL
  Filled 2014-10-11 (×2): qty 16

## 2014-10-11 MED ORDER — NEOMYCIN-POLYMYXIN B GU 40-200000 IR SOLN
Status: DC | PRN
  Administered 2014-10-11: 2 mL

## 2014-10-11 MED ORDER — BUPIVACAINE HCL (PF) 0.5 % IJ SOLN
INTRAMUSCULAR | Status: DC | PRN
  Administered 2014-10-11: 2.5 mL

## 2014-10-11 MED ORDER — NEOMYCIN-POLYMYXIN B GU 40-200000 IR SOLN
Status: AC
  Filled 2014-10-11: qty 2

## 2014-10-11 MED ORDER — CALCIUM CARBONATE ANTACID 500 MG PO CHEW: 1.0000 | CHEWABLE_TABLET | Freq: Every day | ORAL | Status: DC

## 2014-10-11 MED ORDER — FENTANYL CITRATE (PF) 100 MCG/2ML IJ SOLN
25.0000 ug | INTRAMUSCULAR | Status: DC | PRN
  Administered 2014-10-11 (×4): 25 ug via INTRAVENOUS

## 2014-10-11 MED ORDER — ONDANSETRON HCL 4 MG PO TABS: 4.0000 mg | ORAL_TABLET | Freq: Four times a day (QID) | ORAL | Status: DC | PRN

## 2014-10-11 MED ORDER — METOCLOPRAMIDE HCL 10 MG PO TABS
5.0000 mg | ORAL_TABLET | Freq: Two times a day (BID) | ORAL | Status: DC
  Administered 2014-10-11: 5 mg via ORAL
  Filled 2014-10-11: qty 1

## 2014-10-11 MED ORDER — HYDROCODONE-ACETAMINOPHEN 5-325 MG PO TABS
1.0000 | ORAL_TABLET | Freq: Four times a day (QID) | ORAL | Status: DC | PRN
  Administered 2014-10-11: 2 via ORAL
  Administered 2014-10-12: 1 via ORAL
  Administered 2014-10-12 (×2): 2 via ORAL
  Administered 2014-10-13: 1 via ORAL
  Administered 2014-10-13: 2 via ORAL
  Administered 2014-10-13: 1 via ORAL
  Administered 2014-10-14: 2 via ORAL
  Filled 2014-10-11: qty 2
  Filled 2014-10-11: qty 1
  Filled 2014-10-11 (×5): qty 2

## 2014-10-11 MED ORDER — ACETAMINOPHEN 325 MG PO TABS: 650.0000 mg | ORAL_TABLET | Freq: Four times a day (QID) | ORAL | Status: DC | PRN

## 2014-10-11 MED ORDER — ALBUTEROL SULFATE (2.5 MG/3ML) 0.083% IN NEBU: 2.5000 mg | INHALATION_SOLUTION | RESPIRATORY_TRACT | Status: DC | PRN

## 2014-10-11 MED ORDER — CALCIUM CARBONATE 600 MG PO TABS: 600.0000 mg | ORAL_TABLET | Freq: Every day | ORAL | Status: DC

## 2014-10-11 MED ORDER — BUPIVACAINE-EPINEPHRINE (PF) 0.25% -1:200000 IJ SOLN
INTRAMUSCULAR | Status: DC | PRN
  Administered 2014-10-11: 30 mL

## 2014-10-11 MED ORDER — MORPHINE SULFATE 2 MG/ML IJ SOLN
2.0000 mg | INTRAMUSCULAR | Status: DC | PRN
  Administered 2014-10-11 – 2014-10-12 (×2): 2 mg via INTRAVENOUS
  Filled 2014-10-11 (×2): qty 1

## 2014-10-11 MED ORDER — MOMETASONE FURO-FORMOTEROL FUM 100-5 MCG/ACT IN AERO
2.0000 | INHALATION_SPRAY | Freq: Two times a day (BID) | RESPIRATORY_TRACT | Status: DC
  Administered 2014-10-11: 2 via RESPIRATORY_TRACT
  Filled 2014-10-11 (×2): qty 8.8

## 2014-10-11 MED ORDER — MAGNESIUM HYDROXIDE 400 MG/5ML PO SUSP
30.0000 mL | Freq: Every day | ORAL | Status: DC | PRN
  Administered 2014-10-13: 30 mL via ORAL
  Filled 2014-10-11: qty 30

## 2014-10-11 MED ORDER — ALUM & MAG HYDROXIDE-SIMETH 200-200-20 MG/5ML PO SUSP: 30.0000 mL | ORAL | Status: DC | PRN

## 2014-10-11 MED ORDER — FLEET ENEMA 7-19 GM/118ML RE ENEM: 1.0000 | ENEMA | Freq: Once | RECTAL | Status: AC | PRN

## 2014-10-11 MED ORDER — LACTATED RINGERS IV SOLN
INTRAVENOUS | Status: DC | PRN
  Administered 2014-10-11: 16:00:00 via INTRAVENOUS

## 2014-10-11 MED ORDER — KCL IN DEXTROSE-NACL 20-5-0.9 MEQ/L-%-% IV SOLN
INTRAVENOUS | Status: DC
  Administered 2014-10-11: 03:00:00 via INTRAVENOUS
  Filled 2014-10-11 (×2): qty 1000

## 2014-10-11 MED ORDER — ACETAMINOPHEN 650 MG RE SUPP: 650.0000 mg | Freq: Four times a day (QID) | RECTAL | Status: DC | PRN

## 2014-10-11 MED ORDER — FENTANYL CITRATE (PF) 100 MCG/2ML IJ SOLN
INTRAMUSCULAR | Status: DC | PRN
  Administered 2014-10-11: 50 ug via INTRAVENOUS

## 2014-10-11 MED ORDER — ONDANSETRON HCL 4 MG/2ML IJ SOLN
4.0000 mg | Freq: Four times a day (QID) | INTRAMUSCULAR | Status: DC | PRN
  Administered 2014-10-11 – 2014-10-12 (×2): 4 mg via INTRAVENOUS
  Filled 2014-10-11 (×2): qty 2

## 2014-10-11 MED ORDER — HYDROCHLOROTHIAZIDE 25 MG PO TABS: 25.0000 mg | ORAL_TABLET | Freq: Every day | ORAL | Status: DC

## 2014-10-11 MED ORDER — DIAZEPAM 5 MG/ML IJ SOLN
2.5000 mg | Freq: Once | INTRAMUSCULAR | Status: AC
  Administered 2014-10-11: 2.5 mg via INTRAVENOUS
  Filled 2014-10-11: qty 2

## 2014-10-11 MED ORDER — ZOLPIDEM TARTRATE 5 MG PO TABS
5.0000 mg | ORAL_TABLET | Freq: Every evening | ORAL | Status: DC | PRN
Start: 2014-10-11 — End: 2014-10-14

## 2014-10-11 MED ORDER — SENNA 8.6 MG PO TABS
1.0000 | ORAL_TABLET | Freq: Two times a day (BID) | ORAL | Status: DC
  Administered 2014-10-11 – 2014-10-14 (×6): 8.6 mg via ORAL
  Filled 2014-10-11 (×6): qty 1

## 2014-10-11 MED ORDER — SODIUM CHLORIDE 0.45 % IV SOLN
INTRAVENOUS | Status: DC
  Administered 2014-10-11 – 2014-10-12 (×2): via INTRAVENOUS

## 2014-10-11 MED ORDER — FERROUS SULFATE 325 (65 FE) MG PO TABS
325.0000 mg | ORAL_TABLET | Freq: Every day | ORAL | Status: DC
  Administered 2014-10-12 – 2014-10-14 (×3): 325 mg via ORAL
  Filled 2014-10-11 (×3): qty 1

## 2014-10-11 MED ORDER — METOCLOPRAMIDE HCL 10 MG PO TABS
5.0000 mg | ORAL_TABLET | Freq: Three times a day (TID) | ORAL | Status: DC | PRN
  Administered 2014-10-12: 10 mg via ORAL
  Filled 2014-10-11: qty 1

## 2014-10-11 MED ORDER — GUAIFENESIN ER 600 MG PO TB12: 600.0000 mg | ORAL_TABLET | Freq: Two times a day (BID) | ORAL | Status: DC

## 2014-10-11 MED ORDER — ONDANSETRON HCL 4 MG/2ML IJ SOLN
4.0000 mg | Freq: Four times a day (QID) | INTRAMUSCULAR | Status: DC | PRN
  Administered 2014-10-11: 4 mg via INTRAVENOUS
  Filled 2014-10-11: qty 2

## 2014-10-11 MED ORDER — MORPHINE SULFATE 4 MG/ML IJ SOLN: 4.0000 mg | INTRAMUSCULAR | Status: DC | PRN

## 2014-10-11 SURGICAL SUPPLY — 49 items
4.0 RADIOLUCENT DRILL BIT ×3 IMPLANT
BIT DRILL GRAY 5X250 (MISCELLANEOUS) IMPLANT
BIT DRILL RADIO 4.0 (BIT) ×3 IMPLANT
BLADE HELICAL 90 (Orthopedic Implant) ×2 IMPLANT
BLADE HELICAL 90MM (Orthopedic Implant) ×1 IMPLANT
BNDG COHESIVE 4X5 TAN STRL (GAUZE/BANDAGES/DRESSINGS) ×3 IMPLANT
CANISTER SUCT 1200ML W/VALVE (MISCELLANEOUS) ×3 IMPLANT
CHLORAPREP W/TINT 26ML (MISCELLANEOUS) ×6 IMPLANT
DRAPE C-ARMOR (DRAPES) ×3 IMPLANT
DRAPE INCISE 23X17 IOBAN STRL (DRAPES) ×2
DRAPE INCISE IOBAN 23X17 STRL (DRAPES) ×1 IMPLANT
DRSG AQUACEL AG ADV 3.5X10 (GAUZE/BANDAGES/DRESSINGS) ×3 IMPLANT
DRSG OPSITE POSTOP 4X8 (GAUZE/BANDAGES/DRESSINGS) ×9 IMPLANT
GAUZE PETRO XEROFOAM 1X8 (MISCELLANEOUS) IMPLANT
GAUZE SPONGE 4X4 12PLY STRL (GAUZE/BANDAGES/DRESSINGS) ×3 IMPLANT
GLOVE BIO SURGEON STRL SZ 6.5 (GLOVE) ×6 IMPLANT
GLOVE BIO SURGEON STRL SZ8 (GLOVE) ×3 IMPLANT
GLOVE BIO SURGEONS STRL SZ 6.5 (GLOVE) ×3
GLOVE BIOGEL PI IND STRL 7.0 (GLOVE) ×2 IMPLANT
GLOVE BIOGEL PI INDICATOR 7.0 (GLOVE) ×4
GLOVE INDICATOR 8.0 STRL GRN (GLOVE) ×6 IMPLANT
GLOVE SURG ORTHO 8.5 STRL (GLOVE) ×3 IMPLANT
GOWN STRL REUS W/ TWL LRG LVL3 (GOWN DISPOSABLE) ×3 IMPLANT
GOWN STRL REUS W/TWL LRG LVL3 (GOWN DISPOSABLE) ×6
GUIDE WIRE ×3 IMPLANT
GUIDEWIRE 3.2X400 (WIRE) ×3 IMPLANT
KIT RM TURNOVER STRD PROC AR (KITS) ×3 IMPLANT
MAT BLUE FLOOR 46X72 FLO (MISCELLANEOUS) ×3 IMPLANT
NAIL FIXATION (Nail) ×3 IMPLANT
NDL SAFETY ECLIPSE 18X1.5 (NEEDLE) ×1 IMPLANT
NEEDLE HYPO 18GX1.5 SHARP (NEEDLE) ×2
NEEDLE SPNL 18GX3.5 QUINCKE PK (NEEDLE) ×3 IMPLANT
NS IRRIG 500ML POUR BTL (IV SOLUTION) ×3 IMPLANT
PACK HIP COMPR (MISCELLANEOUS) ×3 IMPLANT
PAD GROUND ADULT SPLIT (MISCELLANEOUS) ×3 IMPLANT
REAMER ROD DEEP FLUTE 2.5X950 (INSTRUMENTS) ×6 IMPLANT
REAMING ROD ×6 IMPLANT
SCREW LOCK TI 5.0X48 F/IM NAIL (Screw) ×3 IMPLANT
STAPLER SKIN PROX 35W (STAPLE) ×3 IMPLANT
SUCTION FRAZIER TIP 10 FR DISP (SUCTIONS) ×3 IMPLANT
SUT VIC AB 0 CT1 36 (SUTURE) ×3 IMPLANT
SUT VIC AB 2-0 CT1 27 (SUTURE) ×2
SUT VIC AB 2-0 CT1 TAPERPNT 27 (SUTURE) ×1 IMPLANT
SYR 30ML LL (SYRINGE) ×6 IMPLANT
SYR 5ML LL (SYRINGE) ×3 IMPLANT
TFN ×3 IMPLANT
VISIBLE OPSITE ×9 IMPLANT
helical blade ×3 IMPLANT
locking screw 5.0mm ×3 IMPLANT

## 2014-10-11 NOTE — H&P (Signed)
[facility] [place] NAME: Yolanda Taylor    MR#:  [id number]  DATE OF BIRTH:  Jan 06, 1931  DATE OF ADMISSION:  10/10/2014  PRIMARY CARE PHYSICIAN: Dion Body, MD   REQUESTING/REFERRING PHYSICIAN: Dr. Jacqualine Code  CHIEF COMPLAINT:   Fall HISTORY OF PRESENT ILLNESS:  Yolanda Taylor  is a 81 y.o. female with a known history of hypertension, COPD and history of right breast cancer under remission is presenting to the ED after she sustained a fall at home. Patient tripped over in her bathroom at this evening and fell on the floor, following which she started having severe left-sided hip pain and was unable to stand up. Patient is brought into the ED. Denies any head injury or passing out. Denies any cardiac history. X-ray of the left hip has revealed left proximal femoral fracture. ED physician has consulted on-call orthopedics doctor Dr. Emily Filbert who has recommended to admit to hospitalist service  for possible surgery in a.m.  PAST MEDICAL HISTORY:   Past Medical History  Diagnosis Date  . Cancer   . Hypertension   . COPD (chronic obstructive pulmonary disease)     PAST SURGICAL HISTOIRY:   Past Surgical History  Procedure Laterality Date  . Breast surgery    . Cholecystectomy    . Abdominal hysterectomy    . Eye surgery    . Hernia repair      SOCIAL HISTORY:   History  Substance Use Topics  . Smoking status: Never Smoker   . Smokeless tobacco: Not on file  . Alcohol Use: No    FAMILY HISTORY:  History reviewed. No pertinent family history.  DRUG ALLERGIES:   Allergies  Allergen Reactions  . Paroxetine Hcl Other (See Comments)    "Woozie"    REVIEW OF SYSTEMS:  CONSTITUTIONAL: No fever, fatigue or weakness.  EYES: No blurred or double vision.  EARS, NOSE, AND THROAT: No tinnitus or ear pain. Patient is hard of hearing RESPIRATORY: No cough, shortness of breath, wheezing or hemoptysis.   CARDIOVASCULAR: No chest pain, orthopnea, edema.  GASTROINTESTINAL: No nausea, vomiting, diarrhea or abdominal pain.  GENITOURINARY: No dysuria, hematuria.  ENDOCRINE: No polyuria, nocturia,  HEMATOLOGY: No anemia, easy bruising or bleeding SKIN: No rash or lesion. MUSCULOSKELETAL: No joint pain or arthritis.  Reporting left lower extremity pain NEUROLOGIC: No tingling, numbness, weakness.  PSYCHIATRY: No anxiety or depression.   MEDICATIONS AT HOME:   Prior to Admission medications   Medication Sig Start Date End Date Taking? Authorizing Provider  acetaminophen (TYLENOL) 500 MG tablet Take 1 tablet by mouth every 6 (six) hours as needed.   Yes Historical Provider, MD  albuterol (PROVENTIL HFA;VENTOLIN HFA) 108 (90 BASE) MCG/ACT inhaler Inhale 2 puffs into the lungs every 4 (four) hours as needed for wheezing or shortness of breath.   Yes Historical Provider, MD  aspirin 81 MG tablet Take 81 mg by mouth daily.   Yes Historical Provider, MD  calcium carbonate (CALCIUM 600) 600 MG TABS tablet Take 600 mg by mouth daily.   Yes Historical Provider, MD  fluticasone (FLONASE) 50 MCG/ACT nasal spray Place 2 sprays into both nostrils daily.   Yes Historical Provider, MD  Fluticasone-Salmeterol (ADVAIR DISKUS) 250-50 MCG/DOSE AEPB Inhale 1 puff into the lungs 2 (two) times daily.   Yes Historical Provider, MD  guaiFENesin (MUCINEX) 600 MG 12 hr tablet Take 600 mg by mouth 2 (two) times daily.   Yes Historical Provider, MD  hydrochlorothiazide (  HYDRODIURIL) 25 MG tablet Take 1 tablet by mouth daily. 09/27/14  Yes Historical Provider, MD  metoCLOPramide (REGLAN) 5 MG tablet Take 5 mg by mouth 2 (two) times daily.   Yes Historical Provider, MD  omeprazole (PRILOSEC) 20 MG capsule Take 20 mg by mouth daily.   Yes Historical Provider, MD  pantoprazole (PROTONIX) 40 MG tablet Take 1 tablet by mouth daily. 09/10/14  Yes Historical Provider, MD  Probiotic Product (PROBIOTIC COLON SUPPORT PO) Take 1 capsule  by mouth daily.   Yes Historical Provider, MD  Vitamin D, Ergocalciferol, (DRISDOL) 50000 UNITS CAPS capsule Take 1 capsule by mouth daily. 08/17/14  Yes Historical Provider, MD      VITAL SIGNS:  Blood pressure 164/66, pulse 89, temperature 98.9 F (37.2 C), temperature source Oral, resp. rate 18, height 5\' 5"  (1.651 m), weight 71.442 kg (157 lb 8 oz), SpO2 98 %.  PHYSICAL EXAMINATION:  GENERAL:  81 y.o.-year-old patient lying in the bed with no acute distress.  EYES: Pupils equal, round, reactive to light and accommodation. No scleral icterus. Extraocular muscles intact.  HEENT: Head atraumatic, normocephalic. Oropharynx and nasopharynx clear. Hard of hearing NECK:  Supple, no jugular venous distention. No thyroid enlargement, no tenderness.  LUNGS: Normal breath sounds bilaterally, no wheezing, rales,rhonchi or crepitation. No use of accessory muscles of respiration.  CARDIOVASCULAR: S1, S2 normal. No murmurs, rubs, or gallops.  ABDOMEN: Soft, nontender, nondistended. Bowel sounds present. No organomegaly or mass.  EXTREMITIES: Left lower extremity externally rotated, tender to touch. No pedal edema, cyanosis, or clubbing.  NEUROLOGIC: Cranial nerves II through XII are intact. Muscle strength 5/5 in all extremities. Sensation intact. Gait not checked.  PSYCHIATRIC: The patient is alert and oriented x 3.  SKIN: No obvious rash, lesion, or ulcer.   LABORATORY PANEL:   CBC  Recent Labs Lab 10/10/14 2041  WBC 15.2*  HGB 13.8  HCT 41.8  PLT 231   ------------------------------------------------------------------------------------------------------------------  Chemistries   Recent Labs Lab 10/10/14 2041  NA 140  K 3.5  CL 102  CO2 29  GLUCOSE 123*  BUN 18  CREATININE 0.75  CALCIUM 9.4   ------------------------------------------------------------------------------------------------------------------  Cardiac Enzymes No results for input(s): TROPONINI in the last 168  hours. ------------------------------------------------------------------------------------------------------------------  RADIOLOGY:  Dg Chest 1 View  10/10/2014   CLINICAL DATA:  Preoperative  EXAM: CHEST  1 VIEW  COMPARISON:  05/12/2012  FINDINGS: There is hyperinflation and chronic appearing interstitial coarsening. There is no confluent airspace consolidation. There is no large effusion. There is no pneumothorax.  IMPRESSION: Hyperinflation.  Chronic appearing interstitial coarsening.   Electronically Signed   By: Andreas Newport M.D.   On: 10/10/2014 22:22   Dg Hip Unilat With Pelvis 2-3 Views Left  10/10/2014   CLINICAL DATA:  Fall now with left hip pain and tenderness about the proximal femur. Tripped over rug earlier this afternoon.  EXAM: DG HIP (WITH OR WITHOUT PELVIS) 2-3V LEFT  COMPARISON:  Abdominal radiograph 08/13/2012  FINDINGS: Displaced angulated fracture of the proximal femoral diaphysis. There is greater than 1 shaft width medial displacement of main femoral shaft and at least 3 cm osseous overriding. No definite intertrochanteric extension. The femoral head remains seated in the acetabulum. Pubic rami appear intact. Pelvic calcifications likely combination of phlebolith, vascular, and questionable fibroid, unchanged.  IMPRESSION: Displaced angulated proximal femoral diaphyseal fracture with osseous overriding. No definite extension into the intertrochanteric femur.   Electronically Signed   By: Jeb Levering M.D.   On: 10/10/2014 22:21  EKG:   Orders placed or performed in visit on 09/12/11  . EKG 12-Lead    IMPRESSION AND PLAN:   Yolanda Taylor  is a 81 y.o. female with a known history of hypertension, COPD and history of right breast cancer under remission is presenting to the ED after she sustained a fall at home. Patient tripped over in her bathroom at this evening and fell on the floor, following which she started having severe left-sided hip pain and was  unable to stand up. Patient is brought into the ED. Denies any head injury or passing out. Denies any cardiac history. X-ray of the left hip has revealed left proximal femoral fracture.   1. Left hip pain secondary to left proximal femoral fracture Scheduled for surgery in a.m. Medically cleared for surgery in a.m. Pain management as needed basis Bowel regimen and antiemetics as needed Consult is placed to Dr. Miller-orthopedics  2. Chronic history of COPD currently  no exacerbation Will provide nebulizer treatments as needed basis  3. Hypertension Continue her home medication hydrochlorothiazide  4. History of right breast cancer under remission Outpatient follow-up with oncology as recommended by them  Provide GI prophylaxis Hold pharmacologic DVT prophylaxis as patient is scheduled for surgery in a.m. and provide Ted's    All the records are reviewed and case discussed with ED provider. Management plans discussed with the patient, family and they are in agreement.  CODE STATUS: Full code, daughter is the healthcare power of attorney  TOTAL TIME TAKING CARE OF THIS PATIENT: Reviewing medical records, history and physical, admission orders and coordination of care 45 minutes.    Nicholes Mango M.D on 10/11/2014 at 2:21 AM  Between 7am to 6pm - Pager - [phone]  After 6pm go to [email or web address] - password [email or web address]  Office  [phone]  CC: Primary care physician; Dion Body, MD

## 2014-10-11 NOTE — Care Management Note (Signed)
Case Management Note  Patient Details  Name: Yolanda Taylor MRN: [id number] Date of Birth: 1930/06/07  Subjective/Objective:                   Met with patient and her daughter Yolanda Taylor to discuss discharge planning. Patient pending surgery today to repair hip. She isn't sure what she will need at discharge. Her PCP is Dr. Netty Starring. O2 is new. She has no DME available for use. She uses [facility] for Rx. Patient crying in pain. Action/Plan:   Notified Dava RN of patient patient complaint of pain. List of home health agencies delivered to this room. RNCM will continue to follow.   Expected Discharge Date:  10/15/14               Expected Discharge Plan:     In-House Referral:     Discharge planning Services  CM Consult  Post Acute Care Choice:  Home Health Choice offered to:  Patient, Adult Children  DME Arranged:    DME Agency:     HH Arranged:    [place] Agency:     Status of Service:  In process, will continue to follow  Medicare Important Message Given:    Date Medicare IM Given:    Medicare IM give by:    Date Additional Medicare IM Given:    Additional Medicare Important Message give by:     If discussed at [place] of Stay Meetings, dates discussed:    Additional Comments:  Marshell Garfinkel, RN 10/11/2014, 9:29 AM

## 2014-10-11 NOTE — Anesthesia Procedure Notes (Signed)
Spinal Patient location during procedure: OR Start time: 10/11/2014 4:30 PM End time: 10/11/2014 4:55 PM Staffing Resident/CRNA: Shondrika Hoque Performed by: resident/CRNA  Preanesthetic Checklist Completed: patient identified, site marked, surgical consent, pre-op evaluation, timeout performed, IV checked, risks and benefits discussed and monitors and equipment checked Spinal Block Patient position: sitting Prep: Betadine Patient monitoring: heart rate, continuous pulse ox, blood pressure and cardiac monitor Approach: midline Location: L4-5 Injection technique: single-shot Needle Needle type: Whitacre and Introducer  Needle gauge: 24 G Needle length: 9 cm Needle insertion depth: 6 cm Assessment Sensory level: T10 Additional Notes Negative paresthesia. Negative blood return. Positive free-flowing CSF. Expiration date of kit checked and confirmed. Patient tolerated procedure well, without complications.

## 2014-10-11 NOTE — Op Note (Signed)
DATE OF SURGERY:  10/11/2014  TIME: 6:52 PM  PATIENT NAME:  Yolanda Taylor  AGE: 81 y.o.  PRE-OPERATIVE DIAGNOSIS:  left subtrocanteric femur fracture  POST-OPERATIVE DIAGNOSIS:  SAME  PROCEDURE:  intermedullary nailing of left subtrocanteric  femur  fracture  SURGEON:  Frenchie Dangerfield E   EBL: 50 cc     OPERATIVE IMPLANTS: Synthes trochanteric femoral nail , 130/11 mm/340 mm left with interlocking helical blade 90 mm  and distal locking screw , 48 mm  PREOPERATIVE INDICATIONS:  [name] is a 81 y.o. year old who fell and suffered a subtrochanteric/upper shaft fracture. She was brought into the ER and then admitted and optimized and then elected for surgical intervention.    The risks benefits and alternatives were discussed with the patient including but not limited to the risks of nonoperative treatment, versus surgical intervention including infection, bleeding, nerve injury, malunion, nonunion, hardware prominence, hardware failure, need for hardware removal, blood clots, cardiopulmonary complications, morbidity, mortality, among others, and they were willing to proceed.    OPERATIVE PROCEDURE:  The patient was brought to the operating room and placed in the supine position. General anesthesia was administered, with a foley. She was placed on the fracture table.  Closed reduction was performed under C-arm guidance. The length of the femur was also measured using fluoroscopy. Time out was then performed after sterile prep and drape. She received preoperative antibiotics.  Incision was made proximal to the greater trochanter. A guidewire was placed in the appropriate position. Confirmation was made on AP and lateral views. The above-named nail was opened. I opened the proximal femur with a reamer. I then reamed the femoral shaft to 12.5 mm.  The length was measured at 340 mm.  I then placed the nail to a satisfactory position as seen on fluoroscopy.  The fracture  site was rotated and was felt to be anatomic.  Once the nail was completely seated, I placed a guidepin into the femoral head into the center center position through a second incision.  I measured the length, and then reamed the lateral cortex and up into the head. I then placed the helical blade. Slight compression was applied. Anatomic fixation achieved. Bone quality was mediocre.  I then secured the proximal interlock.  Traction was released to allow for fracture fragment apposition.  The distal locking screw was placed using a freehand technique through a second separate small incision.  After confirming the position of the fracture fragments and hardware I then removed the instruments, and took final C-arm pictures AP and lateral the entire length of the leg. Anatomic reconstruction was achieved, and the wounds were irrigated copiously and closed with Vicryl  followed by staples and sterile gauze for the skin. Sponge and needle count were correct.   The patient was awakened and returned to PACU in Taylor and satisfactory condition. There no complications and the patient tolerated the procedure well.  She will be partial weightbearing as tolerated, and will be on Lovenox  For DVT prophylaxis.     Park Breed, M.D.

## 2014-10-11 NOTE — Transfer of Care (Signed)
Immediate Anesthesia Transfer of Care Note  Patient: Yolanda Taylor  Procedure(s) Performed: Procedure(s): intermedullary nailing of left subtrocanteric  femur  fracture (Left)  Patient Location: PACU  Anesthesia Type:Spinal  Level of Consciousness: awake, alert  and patient cooperative  Airway & Oxygen Therapy: Patient Spontanous Breathing and Patient connected to face mask oxygen  Post-op Assessment: Report given to RN and Post -op Vital signs reviewed and stable  Post vital signs: Reviewed and stable  Last Vitals:  Filed Vitals:   10/11/14 1844  BP: 102/51  Pulse: 91  Temp: 36 C  Resp: 18    Complications: No apparent anesthesia complications

## 2014-10-11 NOTE — Anesthesia Postprocedure Evaluation (Signed)
  Anesthesia Post-op Note  Patient: Yolanda Taylor  Procedure(s) Performed: Procedure(s): intermedullary nailing of left subtrocanteric  femur  fracture (Left)  Anesthesia type:Spinal  Patient location: PACU  Post pain: Pain level controlled  Post assessment: Post-op Vital signs reviewed, Patient's Cardiovascular Status Stable, Respiratory Function Stable, Patent Airway and No signs of Nausea or vomiting  Post vital signs: Reviewed and stable  Last Vitals:  Filed Vitals:   10/11/14 1944  BP: 126/60  Pulse: 86  Temp:   Resp: 12    Level of consciousness: awake, alert  and patient cooperative  Complications: No apparent anesthesia complications

## 2014-10-11 NOTE — Anesthesia Preprocedure Evaluation (Addendum)
Anesthesia Evaluation  Patient identified by MRN, date of birth, ID band Patient awake    Reviewed: Allergy & Precautions, NPO status   Airway Mallampati: II       Dental  (+) Partial Upper, Partial Lower   Pulmonary COPD COPD inhaler,    + decreased breath sounds      Cardiovascular hypertension, Normal cardiovascular exam    Neuro/Psych    GI/Hepatic negative GI ROS, Neg liver ROS,   Endo/Other  diabetes  Renal/GU negative Renal ROS     Musculoskeletal negative musculoskeletal ROS (+)   Abdominal   Peds  Hematology negative hematology ROS (+)   Anesthesia Other Findings   Reproductive/Obstetrics                            Anesthesia Physical Anesthesia Plan  ASA: III  Anesthesia Plan: Spinal   Post-op Pain Management:    Induction: Intravenous  Airway Management Planned: Natural Airway and Nasal Cannula  Additional Equipment:   Intra-op Plan:   Post-operative Plan:   Informed Consent: I have reviewed the patients History and Physical, chart, labs and discussed the procedure including the risks, benefits and alternatives for the proposed anesthesia with the patient or authorized representative who has indicated his/her understanding and acceptance.     Plan Discussed with: CRNA  Anesthesia Plan Comments:         Anesthesia Quick Evaluation

## 2014-10-11 NOTE — Clinical Social Work Note (Signed)
Clinical Social Work Assessment  Patient Details  Name: Yolanda Taylor MRN: [id number] Date of Birth: 04/15/1930  Date of referral:  10/11/14               Reason for consult:  Facility Placement                Permission sought to share information with:  Chartered certified accountant granted to share information::  Yes, Verbal Permission Granted  Name::      [place]::   [place]   Relationship::     Contact Information:     Housing/Transportation Living arrangements for the past 2 months:  [place] of Information:  Adult Children Patient Interpreter Needed:  None Criminal Activity/Legal Involvement Pertinent to Current Situation/Hospitalization:  No - Comment as needed Significant Relationships:  Adult Children Lives with:  Self Do you feel safe going back to the place where you live?  Yes Need for family participation in patient care:  Yes (Comment)  Care giving concerns:  Patient lives alone in [place].    Social Worker assessment / plan:  Holiday representative (CSW) received call from patient's daughter Yolanda Taylor [phone] to discuss D/C plan. Patient had surgery today and will start PT tomorrow. CSW explained that depending on what PT recommends will determine D/C plan. Daughter prefers patient to return home with home health. Per daughter patient lives alone in [place]. [name] is her only child. Daughter reported that she could provide support for patient but not 24 hours a day. Daughter reported that she has an aunt that can possible help as well. CSW explained SNF process. Daughter is agreeable to SNF if needed. Daughter prefers [facility] CSW encouraged daughter to pick a second choice in case [facility] has no beds. Daughter verbalized her understanding.   FL2 complete and faxed out.   Employment status:  Retired Nurse, adult PT Recommendations:  Not assessed  at this time Information / Referral to community resources:  [place]  Patient/Family's Response to care:  Daughter is agreeable to home health or SNF.   Patient/Family's Understanding of and Emotional Response to Diagnosis, Current Treatment, and Prognosis: Daughter was pleasant and thanked CSW for assistance.   Emotional Assessment Appearance:  Appears stated age Attitude/Demeanor/Rapport:  Unable to Assess Affect (typically observed):  Unable to Assess Orientation:  Oriented to Self, Oriented to Place, Oriented to  Time, Oriented to Situation Alcohol / Substance use:  Not Applicable Psych involvement (Current and /or in the community):  No (Comment)  Discharge Needs  Concerns to be addressed:  Discharge Planning Concerns Readmission within the last 30 days:  No Current discharge risk:  Lives alone Barriers to Discharge:  Continued Medical Work up   Elwyn Reach 10/11/2014, 4:46 PM

## 2014-10-11 NOTE — ED Notes (Signed)
Pt states pain is better after valium administration. Pt appears more comfortable.

## 2014-10-11 NOTE — Progress Notes (Signed)
[place] at [place] NAME: Yolanda Taylor    MR#:  [id number]  DATE OF BIRTH:  25-Jan-1931  SUBJECTIVE: 81 year old female patient with hypertension, COPD, history of breast cancer and admission comes in because of fall and found to have a left hip fracture. Complains of nausea due to pain medications, scheduled to have surgery today afternoon.   CHIEF COMPLAINT:   Chief Complaint  Patient presents with  . Fall    REVIEW OF SYSTEMS:    Review of Systems  Constitutional: Negative for fever and chills.  HENT: Negative for hearing loss.   Eyes: Negative for blurred vision, double vision and photophobia.  Respiratory: Negative for cough, hemoptysis and shortness of breath.   Cardiovascular: Negative for palpitations, orthopnea and leg swelling.  Gastrointestinal: Negative for vomiting, abdominal pain and diarrhea.  Genitourinary: Negative for dysuria and urgency.  Musculoskeletal: Positive for joint pain. Negative for myalgias and neck pain.  Skin: Negative for rash.  Neurological: Negative for dizziness, focal weakness, seizures, weakness and headaches.  Psychiatric/Behavioral: Negative for memory loss. The patient does not have insomnia.     Nutrition:  Tolerating Diet: Tolerating PT:      DRUG ALLERGIES:   Allergies  Allergen Reactions  . Paroxetine Hcl Other (See Comments)    "Woozie"    VITALS:  Blood pressure 126/54, pulse 81, temperature 98.5 F (36.9 C), temperature source Oral, resp. rate 18, height 5\' 5"  (1.651 m), weight 71.442 kg (157 lb 8 oz), SpO2 100 %.  PHYSICAL EXAMINATION:   Physical Exam  GENERAL:  81 y.o.-year-old patient lying in the bed with no acute distress.  EYES: Pupils equal, round, reactive to light and accommodation. No scleral icterus. Extraocular muscles intact.  HEENT: Head atraumatic, normocephalic. Oropharynx and nasopharynx clear.  NECK:  Supple, no jugular venous distention. No  thyroid enlargement, no tenderness.  LUNGS: Normal breath sounds bilaterally, no wheezing, rales,rhonchi or crepitation. No use of accessory muscles of respiration.  CARDIOVASCULAR: S1, S2 normal. No murmurs, rubs, or gallops.  ABDOMEN: Soft, nontender, nondistended. Bowel sounds present. No organomegaly or mass.  EXTREMITIES: No pedal edema, cyanosis, or clubbing.  NEUROLOGIC: Cranial nerves II through XII are intact. Muscle strength 5/5 in all extremities. Sensation intact. Gait not checked.  PSYCHIATRIC: The patient is alert and oriented x 3.  SKIN: No obvious rash, lesion, or ulcer.    LABORATORY PANEL:   CBC  Recent Labs Lab 10/11/14 0459  WBC 13.0*  HGB 12.7  HCT 37.3  PLT 203   ------------------------------------------------------------------------------------------------------------------  Chemistries   Recent Labs Lab 10/11/14 0459  NA 140  K 4.0  CL 104  CO2 28  GLUCOSE 139*  BUN 15  CREATININE 0.67  CALCIUM 8.6*   ------------------------------------------------------------------------------------------------------------------  Cardiac Enzymes No results for input(s): TROPONINI in the last 168 hours. ------------------------------------------------------------------------------------------------------------------  RADIOLOGY:  Dg Chest 1 View  10/10/2014   CLINICAL DATA:  Preoperative  EXAM: CHEST  1 VIEW  COMPARISON:  05/12/2012  FINDINGS: There is hyperinflation and chronic appearing interstitial coarsening. There is no confluent airspace consolidation. There is no large effusion. There is no pneumothorax.  IMPRESSION: Hyperinflation.  Chronic appearing interstitial coarsening.   Electronically Signed   By: Andreas Newport M.D.   On: 10/10/2014 22:22   Dg Hip Unilat With Pelvis 2-3 Views Left  10/10/2014   CLINICAL DATA:  Fall now with left hip pain and tenderness about the proximal femur. Tripped over rug earlier this afternoon.  EXAM:  DG HIP (WITH OR  WITHOUT PELVIS) 2-3V LEFT  COMPARISON:  Abdominal radiograph 08/13/2012  FINDINGS: Displaced angulated fracture of the proximal femoral diaphysis. There is greater than 1 shaft width medial displacement of main femoral shaft and at least 3 cm osseous overriding. No definite intertrochanteric extension. The femoral head remains seated in the acetabulum. Pubic rami appear intact. Pelvic calcifications likely combination of phlebolith, vascular, and questionable fibroid, unchanged.  IMPRESSION: Displaced angulated proximal femoral diaphyseal fracture with osseous overriding. No definite extension into the intertrochanteric femur.   Electronically Signed   By: Jeb Levering M.D.   On: 10/10/2014 22:21     ASSESSMENT AND PLAN:   Active Problems:   Femoral condyle fracture   1. Fall with the left femur fracture: Orthopedic consult pending, continue pain medicine, nausea medicine, fluids. DVT prophylaxis with Lovenox #2 COPD stable, no wheezing. #3 hypertension stable #4 history of breast cancer in remission. Patient is at the low risk for planned surgery, continue incentive spirometry after surgery, continue BP medications and for now continue fluids and watch for the blood loss anemia after surgery.      All the records are reviewed and case discussed with Care Management/Social Workerr. Management plans discussed with the patient, family and they are in agreement.  CODE STATUS: full  TOTAL TIME TAKING CARE OF THIS PATIENT: 78minutes.   POSSIBLE D/C IN 1-2 DAYS, DEPENDING ON CLINICAL CONDITION.   Epifanio Lesches M.D on 10/11/2014 at 9:11 AM  Between 7am to 6pm - Pager - [phone]  After 6pm go to [email or web address] - password [email or web address]  Office  [phone]  CC: Primary care physician; Dion Body, MD

## 2014-10-11 NOTE — ED Notes (Signed)
Pt states "help me the pain is back, i can't stand it." muscle spasms noted to left thigh. Order for valium received from dr. Dahlia Client. Family and pt updated for 5th time on admission process.

## 2014-10-11 NOTE — Clinical Social Work Placement (Signed)
   CLINICAL SOCIAL WORK PLACEMENT  NOTE  Date:  10/11/2014  Patient Details  Name: Yolanda Taylor MRN: [id number] Date of Birth: [birth year]  Clinical Social Work is seeking post-discharge placement for this patient at the [place] level of care (*CSW will initial, date and re-position this form in  chart as items are completed):  Yes   Patient/family provided with [facility] Work Department's list of facilities offering this level of care within the geographic area requested by the patient (or if unable, by the patient's family).  Yes   Patient/family informed of their freedom to choose among providers that offer the needed level of care, that participate in Medicare, Medicaid or managed care program needed by the patient, have an available bed and are willing to accept the patient.  Yes   Patient/family informed of [facility]'s ownership interest in [facility] and [facility], as well as of the fact that they are under no obligation to receive care at these facilities.  PASRR submitted to EDS on 10/11/14     PASRR number received on 10/11/14     Existing PASRR number confirmed on       FL2 transmitted to all facilities in geographic area requested by pt/family on 10/11/14     FL2 transmitted to all facilities within larger geographic area on       Patient informed that his/her managed care company has contracts with or will negotiate with certain facilities, including the following:            Patient/family informed of bed offers received.  Patient chooses bed at       Physician recommends and patient chooses bed at      Patient to be transferred to   on  .  Patient to be transferred to facility by       Patient family notified on   of transfer.  Name of family member notified:        PHYSICIAN       Additional Comment:    _______________________________________________ Loralyn Freshwater, LCSW 10/11/2014, 4:44  PM

## 2014-10-11 NOTE — Consult Note (Signed)
ORTHOPAEDIC CONSULTATION  REQUESTING PHYSICIAN: Epifanio Lesches, MD  Chief Complaint: Left hip pain  HPI: Yolanda Taylor is a 81 y.o. female who complains of  left hip and upper leg pain following a fall at home last evening.  She tripped over a rug and landed on her left leg.  She took 4 hours before she could get hold of her daughter and be brought to the ER.  Exam and x-rays at that time showed a displaced fracture of the proximal left femur in the subtrochanteric and upper shaft area.  She was admitted for medical evaluation and surgical fixation of the fracture.  I've discussed treatment options with the patient and her daughter and I recommended operative fixation of the fracture.  Some benefits were discussed with him at length.  They are in agreement.  We plan to proceed this afternoon.  Past Medical History  Diagnosis Date  . Cancer   . Hypertension   . COPD (chronic obstructive pulmonary disease)    Past Surgical History  Procedure Laterality Date  . Breast surgery    . Cholecystectomy    . Abdominal hysterectomy    . Eye surgery    . Hernia repair     History   Social History  . Marital Status: Widowed    Spouse Name: N/A  . Number of Children: N/A  . Years of Education: N/A   Social History Main Topics  . Smoking status: Never Smoker   . Smokeless tobacco: Not on file  . Alcohol Use: No  . Drug Use: No  . Sexual Activity: No   Other Topics Concern  . None   Social History Narrative  . None   History reviewed. No pertinent family history. Allergies  Allergen Reactions  . Paroxetine Hcl Other (See Comments)    "Woozie"   Prior to Admission medications   Medication Sig Start Date End Date Taking? Authorizing Provider  acetaminophen (TYLENOL) 500 MG tablet Take 1 tablet by mouth every 6 (six) hours as needed.   Yes Historical Provider, MD  albuterol (PROVENTIL HFA;VENTOLIN HFA) 108 (90 BASE) MCG/ACT inhaler Inhale 2 puffs into the lungs every  4 (four) hours as needed for wheezing or shortness of breath.   Yes Historical Provider, MD  aspirin 81 MG tablet Take 81 mg by mouth daily.   Yes Historical Provider, MD  calcium carbonate (CALCIUM 600) 600 MG TABS tablet Take 600 mg by mouth daily.   Yes Historical Provider, MD  fluticasone (FLONASE) 50 MCG/ACT nasal spray Place 2 sprays into both nostrils daily.   Yes Historical Provider, MD  Fluticasone-Salmeterol (ADVAIR DISKUS) 250-50 MCG/DOSE AEPB Inhale 1 puff into the lungs 2 (two) times daily.   Yes Historical Provider, MD  guaiFENesin (MUCINEX) 600 MG 12 hr tablet Take 600 mg by mouth 2 (two) times daily.   Yes Historical Provider, MD  hydrochlorothiazide (HYDRODIURIL) 25 MG tablet Take 1 tablet by mouth daily. 09/27/14  Yes Historical Provider, MD  metoCLOPramide (REGLAN) 5 MG tablet Take 5 mg by mouth 2 (two) times daily.   Yes Historical Provider, MD  omeprazole (PRILOSEC) 20 MG capsule Take 20 mg by mouth daily.   Yes Historical Provider, MD  pantoprazole (PROTONIX) 40 MG tablet Take 1 tablet by mouth daily. 09/10/14  Yes Historical Provider, MD  Probiotic Product (PROBIOTIC COLON SUPPORT PO) Take 1 capsule by mouth daily.   Yes Historical Provider, MD  Vitamin D, Ergocalciferol, (DRISDOL) 50000 UNITS CAPS capsule Take 1 capsule by mouth  daily. 08/17/14  Yes Historical Provider, MD   Dg Chest 1 View  10/10/2014   CLINICAL DATA:  Preoperative  EXAM: CHEST  1 VIEW  COMPARISON:  05/12/2012  FINDINGS: There is hyperinflation and chronic appearing interstitial coarsening. There is no confluent airspace consolidation. There is no large effusion. There is no pneumothorax.  IMPRESSION: Hyperinflation.  Chronic appearing interstitial coarsening.   Electronically Signed   By: Andreas Newport M.D.   On: 10/10/2014 22:22   Dg Hip Unilat With Pelvis 2-3 Views Left  10/10/2014   CLINICAL DATA:  Fall now with left hip pain and tenderness about the proximal femur. Tripped over rug earlier this  afternoon.  EXAM: DG HIP (WITH OR WITHOUT PELVIS) 2-3V LEFT  COMPARISON:  Abdominal radiograph 08/13/2012  FINDINGS: Displaced angulated fracture of the proximal femoral diaphysis. There is greater than 1 shaft width medial displacement of main femoral shaft and at least 3 cm osseous overriding. No definite intertrochanteric extension. The femoral head remains seated in the acetabulum. Pubic rami appear intact. Pelvic calcifications likely combination of phlebolith, vascular, and questionable fibroid, unchanged.  IMPRESSION: Displaced angulated proximal femoral diaphyseal fracture with osseous overriding. No definite extension into the intertrochanteric femur.   Electronically Signed   By: Jeb Levering M.D.   On: 10/10/2014 22:21    Positive ROS: All other systems have been reviewed and were otherwise negative with the exception of those mentioned in the HPI and as above.  Physical Exam: General: Alert, no acute distress Cardiovascular: No pedal edema Respiratory: No cyanosis, no use of accessory musculature GI: No organomegaly, abdomen is soft and non-tender Skin: No lesions in the area of chief complaint Neurologic: Sensation intact distally Psychiatric: Patient is competent for consent with normal mood and affect Lymphatic: No axillary or cervical lymphadenopathy  MUSCULOSKELETAL: Patient is alert but in obvious pain.  The left leg is slightly shortened and has severe pain with movement.  The neurovascular status is good distally.  The skin is intact.  The right leg is normal and no other orthopedic injuries are noted.  Assessment: Displaced proximal left femoral shaft/subtrochanteric fracture  Plan: Open reduction internal fixation with a long trochanteric fixation nail    [place] Breed, MD [phone]   10/11/2014 1:10 PM

## 2014-10-11 NOTE — Progress Notes (Signed)
OR tech here to take patient to surgery, consents on chart, antibiotic x 2 sent in chart, family at side.

## 2014-10-11 NOTE — Progress Notes (Signed)
Right hearing aid and lower partial plate

## 2014-10-11 NOTE — Progress Notes (Signed)
RN SPOKE WITH DR Vianne Bulls. PAIN NOT CONTROLLED. HAVE PAGED DR MILLER . NO RESPONSE . MD ORDERS VALIUM 5MG  PO Q8HR PRN

## 2014-10-11 NOTE — Progress Notes (Signed)
Dr Sabra Heck on rounds. md orders obtain consent for orif left femur fracture ( repair left femur with orthopedic device) and blood consent

## 2014-10-11 NOTE — Progress Notes (Signed)
Leg spasm

## 2014-10-12 ENCOUNTER — Encounter: Payer: Self-pay | Admitting: Specialist

## 2014-10-12 LAB — CBC
HCT: 33 % — ABNORMAL LOW (ref 35.0–47.0)
Hemoglobin: 10.8 g/dL — ABNORMAL LOW (ref 12.0–16.0)
MCH: 30.3 pg (ref 26.0–34.0)
MCHC: 32.6 g/dL (ref 32.0–36.0)
MCV: 93 fL (ref 80.0–100.0)
Platelets: 154 10*3/uL (ref 150–440)
RBC: 3.55 MIL/uL — ABNORMAL LOW (ref 3.80–5.20)
RDW: 13 % (ref 11.5–14.5)
WBC: 11.3 10*3/uL — AB (ref 3.6–11.0)

## 2014-10-12 LAB — BASIC METABOLIC PANEL
ANION GAP: 5 (ref 5–15)
BUN: 10 mg/dL (ref 6–20)
CO2: 27 mmol/L (ref 22–32)
CREATININE: 0.65 mg/dL (ref 0.44–1.00)
Calcium: 7.8 mg/dL — ABNORMAL LOW (ref 8.9–10.3)
Chloride: 105 mmol/L (ref 101–111)
GLUCOSE: 126 mg/dL — AB (ref 65–99)
POTASSIUM: 3.9 mmol/L (ref 3.5–5.1)
Sodium: 137 mmol/L (ref 135–145)

## 2014-10-12 LAB — VITAMIN D 25 HYDROXY (VIT D DEFICIENCY, FRACTURES): VIT D 25 HYDROXY: 48.8 ng/mL (ref 30.0–100.0)

## 2014-10-12 MED ORDER — MOMETASONE FURO-FORMOTEROL FUM 100-5 MCG/ACT IN AERO
2.0000 | INHALATION_SPRAY | Freq: Two times a day (BID) | RESPIRATORY_TRACT | Status: DC
  Administered 2014-10-12 – 2014-10-14 (×4): 2 via RESPIRATORY_TRACT

## 2014-10-12 MED ORDER — PANTOPRAZOLE SODIUM 40 MG PO TBEC
40.0000 mg | DELAYED_RELEASE_TABLET | Freq: Every day | ORAL | Status: DC
  Administered 2014-10-12 – 2014-10-14 (×3): 40 mg via ORAL
  Filled 2014-10-12 (×3): qty 1

## 2014-10-12 MED ORDER — FLUTICASONE PROPIONATE 50 MCG/ACT NA SUSP
2.0000 | Freq: Every day | NASAL | Status: DC
  Administered 2014-10-13 – 2014-10-14 (×2): 2 via NASAL

## 2014-10-12 NOTE — Progress Notes (Signed)
Subjective: 1 Day Post-Op Procedure(s) (LRB): intermedullary nailing of left subtrocanteric  femur  fracture (Left)    Patient reports pain as mild. Alert and oob in chair getting her nails done. Dressing dry.  hgb 10.8 Objective:   VITALS:   Filed Vitals:   10/12/14 0801  BP: 123/51  Pulse: 96  Temp: 97.9 F (36.6 C)  Resp: 20    Neurologically intact Sensation intact distally Intact pulses distally Dorsiflexion/Plantar flexion intact Incision: no drainage  LABS  Recent Labs  10/10/14 2041 10/11/14 0459 10/12/14 0424  HGB 13.8 12.7 10.8*  HCT 41.8 37.3 33.0*  WBC 15.2* 13.0* 11.3*  PLT 231 203 154     Recent Labs  10/10/14 2041 10/11/14 0459 10/12/14 0424  NA 140 140 137  K 3.5 4.0 3.9  BUN 18 15 10   CREATININE 0.75 0.67 0.65  GLUCOSE 123* 139* 126*     Recent Labs  10/10/14 2041 10/11/14 0459  INR 0.92 0.99     Assessment/Plan: 1 Day Post-Op Procedure(s) (LRB): intermedullary nailing of left subtrocanteric  femur  fracture (Left)   Advance diet Up with therapy

## 2014-10-12 NOTE — Progress Notes (Signed)
[place] at [place] NAME: Yolanda [facility]    MR#:  [id number]  DATE OF BIRTH:  1931-01-29  SUBJECTIVE: 81 year old female patient with hypertension, COPD, history of breast cancer and admission comes in because of fall and found to have a left hip fracture. S./p surgery.   CHIEF COMPLAINT:   Chief Complaint  Patient presents with  . Fall    REVIEW OF SYSTEMS:    Review of Systems  Constitutional: Negative for fever and chills.  HENT: Negative for hearing loss.   Eyes: Negative for blurred vision, double vision and photophobia.  Respiratory: Negative for cough, hemoptysis and shortness of breath.   Cardiovascular: Negative for palpitations, orthopnea and leg swelling.  Gastrointestinal: Negative for vomiting, abdominal pain and diarrhea.  Genitourinary: Negative for dysuria and urgency.  Musculoskeletal: Positive for joint pain. Negative for myalgias and neck pain.  Skin: Negative for rash.  Neurological: Negative for dizziness, focal weakness, seizures, weakness and headaches.  Psychiatric/Behavioral: Negative for memory loss. The patient does not have insomnia.     Nutrition:  Tolerating Diet: Tolerating PT:      DRUG ALLERGIES:   Allergies  Allergen Reactions  . Paroxetine Hcl Other (See Comments)    "Woozie"    VITALS:  Blood pressure 123/51, pulse 96, temperature 97.9 F (36.6 C), temperature source Oral, resp. rate 20, height 5\' 5"  (1.651 m), weight 71.442 kg (157 lb 8 oz), SpO2 98 %.  PHYSICAL EXAMINATION:   Physical Exam  GENERAL:  81 y.o.-year-old patient lying in the bed with no acute distress.  EYES: Pupils equal, round, reactive to light and accommodation. No scleral icterus. Extraocular muscles intact.  HEENT: Head atraumatic, normocephalic. Oropharynx and nasopharynx clear.  NECK:  Supple, no jugular venous distention. No thyroid enlargement, no tenderness.  LUNGS: Normal breath sounds  bilaterally, no wheezing, rales,rhonchi or crepitation. No use of accessory muscles of respiration.  CARDIOVASCULAR: S1, S2 normal. No murmurs, rubs, or gallops.  ABDOMEN: Soft, nontender, nondistended. Bowel sounds present. No organomegaly or mass.  EXTREMITIES: Left the hip surgery status post dressing present strength 5/5 in all extremities. Sensation intact. Gait not checked.  PSYCHIATRIC: The patient is alert and oriented x 3.  SKIN: No obvious rash, lesion, or ulcer.    LABORATORY PANEL:   CBC  Recent Labs Lab 10/12/14 0424  WBC 11.3*  HGB 10.8*  HCT 33.0*  PLT 154   ------------------------------------------------------------------------------------------------------------------  Chemistries   Recent Labs Lab 10/12/14 0424  NA 137  K 3.9  CL 105  CO2 27  GLUCOSE 126*  BUN 10  CREATININE 0.65  CALCIUM 7.8*   ------------------------------------------------------------------------------------------------------------------  Cardiac Enzymes No results for input(s): TROPONINI in the last 168 hours. ------------------------------------------------------------------------------------------------------------------  RADIOLOGY:  Dg Chest 1 View  10/10/2014   CLINICAL DATA:  Preoperative  EXAM: CHEST  1 VIEW  COMPARISON:  05/12/2012  FINDINGS: There is hyperinflation and chronic appearing interstitial coarsening. There is no confluent airspace consolidation. There is no large effusion. There is no pneumothorax.  IMPRESSION: Hyperinflation.  Chronic appearing interstitial coarsening.   Electronically Signed   By: Andreas Newport M.D.   On: 10/10/2014 22:22   Dg C-arm 61-120 Min-no Report  10/11/2014   CLINICAL DATA: Fracture   C-ARM 61-120 MINUTES  Fluoroscopy was utilized by the requesting physician.  No radiographic  interpretation.    Dg Hip Unilat With Pelvis 2-3 Views Left  10/10/2014   CLINICAL DATA:  Fall now with left hip pain  and tenderness about the proximal  femur. Tripped over rug earlier this afternoon.  EXAM: DG HIP (WITH OR WITHOUT PELVIS) 2-3V LEFT  COMPARISON:  Abdominal radiograph 08/13/2012  FINDINGS: Displaced angulated fracture of the proximal femoral diaphysis. There is greater than 1 shaft width medial displacement of main femoral shaft and at least 3 cm osseous overriding. No definite intertrochanteric extension. The femoral head remains seated in the acetabulum. Pubic rami appear intact. Pelvic calcifications likely combination of phlebolith, vascular, and questionable fibroid, unchanged.  IMPRESSION: Displaced angulated proximal femoral diaphyseal fracture with osseous overriding. No definite extension into the intertrochanteric femur.   Electronically Signed   By: Jeb Levering M.D.   On: 10/10/2014 22:21   Dg Femur Port Min 2 Views Left  10/11/2014   CLINICAL DATA:  Postop  EXAM: LEFT FEMUR PORTABLE 2 VIEWS  COMPARISON:  10/10/2014  FINDINGS: Intra medullary rod and femoral neck screw now transfix a proximal femur diaphysis fracture. There is 1 distal interlocking screw. Anatomic alignment. No breakage or loosening of the hardware.  IMPRESSION: ORIF left femur fracture.   Electronically Signed   By: Marybelle Killings M.D.   On: 10/11/2014 21:24     ASSESSMENT AND PLAN:   Active Problems:   Femoral condyle fracture   1. Fall with the left femur fracture: Status post surgery, continue pain medicine, nausea medicine, DVT prophylaxis with Lovenox, physical therapy consult and possible placement. Continue incentive spirometry #2 COPD stable, no wheezing. #3 hypertension stable #4 history of breast cancer in remission.     All the records are reviewed and case discussed with Care Management/Social Workerr. Management plans discussed with the patient, family and they are in agreement.  CODE STATUS: full  TOTAL TIME TAKING CARE OF THIS PATIENT: 43minutes.   POSSIBLE D/C IN 1-2 DAYS, DEPENDING ON CLINICAL  CONDITION.   Epifanio Lesches M.D on 10/12/2014 at 10:03 AM  Between 7am to 6pm - Pager - [phone]  After 6pm go to [email or web address] - password [email or web address] [facility]  Office  [phone]  CC: Primary care physician; Dion Body, MD

## 2014-10-12 NOTE — Progress Notes (Signed)
Clinical Social Worker (CSW) met with patient and her daughter Yolanda [facility] at bedside. PT is recommending SNF. Patient is agreeable to SNF. Patient is concerned about her hospital bill and co-pays. CSW left a voicemail with a finical counselor and gave daughter phone number to the billing department. CSW encouraged daughter to call the customer service number on the back of patient's insurance card to inquire about SNF co-pays.  CSW presented bed offers. Patient chose [facility]. Patient reported that she is hopeful to progress enough to return home. CSW will continue to follow and assist as needed.   Yolanda [facility], [name] [phone]

## 2014-10-12 NOTE — Evaluation (Signed)
Physical Therapy Evaluation Patient Details Name: Yolanda Taylor MRN: [id number] DOB: 24-Jan-1931 Today's Date: 10/12/2014   History of Present Illness  This patient is an 81 year old female who fell and suffered a L hip fracture. She recieved an ORIF repair  Clinical Impression  Pt presents with hx of cancer, HTN, and COPD. Examination reveals that pt has a great fear of her pain and consequently is afraid to move. However, she will perform mobility after encouragement. She ambulates with mod assist, transfers with max assist +2, and performs bed mobility +2. Her primary physical deficits include decreased strength, ROM, activity tolerance and acute pain. She will benefit from skilled PT to address these deficits for eventual safe return home. Pt's O2 sats resting on room air were 92 %. All mobility was performed on room air and pt's O2 never fell below 90%. She returned to mid 90s shortly after mobility, so was taken off O2. Nurse was notified of vitals and that pt was off O2.     Follow Up Recommendations SNF    Equipment Recommendations  Rolling walker with 5" wheels    Recommendations for Other Services       Precautions / Restrictions Precautions Precautions: Fall Restrictions Weight Bearing Restrictions: Yes LLE Weight Bearing: Partial weight bearing LLE Partial Weight Bearing Percentage or Pounds: 50      Mobility  Bed Mobility Overal bed mobility:  (Not tested up in chair) Bed Mobility: Supine to Sit     Supine to sit: Max assist;+2 for physical assistance     General bed mobility comments: Pt requires assist for bodyweight support and management of LEs. Pt reports lots of pain with any mobility   Transfers Overall transfer level: Needs assistance Equipment used: Rolling walker (2 wheeled) Transfers: Sit to/from Stand Sit to Stand: Mod assist;+2 physical assistance         General transfer comment: Adjusted rw for appropriate height; pt pivots RLE  versus stepping. Transferred to/from bedside commode  Ambulation/Gait Ambulation/Gait assistance: Mod assist Ambulation Distance (Feet): 3 Feet Assistive device: Rolling walker (2 wheeled) Gait Pattern/deviations: Decreased step length - right;Decreased step length - left;Step-to pattern;Scissoring Gait velocity: decreased   General Gait Details: Pivots on R foot   Stairs            Wheelchair Mobility    Modified Rankin (Stroke Patients Only)       Balance Overall balance assessment: History of Falls (History of falls)                                           Pertinent Vitals/Pain Pain Assessment: 0-10 Pain Score: 4  Pain Location: L hip Pain Intervention(s): Limited activity within patient's tolerance;Monitored during session;RN gave pain meds during session    Home Living Family/patient expects to be discharged to:: Private residence Living Arrangements: Alone Available Help at Discharge: Family Type of Home: House Home Access: Stairs to enter Entrance Stairs-Rails: None Entrance Stairs-Number of Steps: 2 Home Layout: One level   Additional Comments: Daughter does not live far away and can provide assistance.     Prior Function Level of Independence: Independent               Hand Dominance        Extremity/Trunk Assessment   Upper Extremity Assessment: Overall WFL for tasks assessed  Lower Extremity Assessment: Defer to PT evaluation   LLE Deficits / Details: decreased strength, ROM, and acute pain     Communication   Communication: No difficulties  Cognition Arousal/Alertness: Awake/alert Behavior During Therapy: WFL for tasks assessed/performed Overall Cognitive Status: Within Functional Limits for tasks assessed                      General Comments      Exercises Total Joint Exercises Ankle Circles/Pumps: AROM;10 reps;Both Quad Sets: Strengthening;Both;20 reps;Seated Gluteal Sets:  Strengthening;Both;20 reps;Seated Short Arc Quad: AROM;Left;20 reps;Seated Heel Slides: AAROM;Left;20 reps;Seated (R AROM) Hip ABduction/ADduction: AAROM;Left;20 reps;Seated (R AROM) Straight Leg Raises: AROM;10 reps;Both Other Exercises Other Exercises: Chair pushup 10x      Assessment/Plan    PT Assessment Patient needs continued PT services  PT Diagnosis Difficulty walking;Abnormality of gait;Generalized weakness;Acute pain   PT Problem List Decreased strength;Decreased range of motion;Decreased activity tolerance;Decreased balance;Decreased mobility;Decreased knowledge of use of DME;Decreased safety awareness;Decreased knowledge of precautions;Pain  PT Treatment Interventions DME instruction;Gait training;Stair training;Functional mobility training;Therapeutic activities;Balance training;Therapeutic exercise;Neuromuscular re-education;Patient/family education   PT Goals (Current goals can be found in the Care Plan section) Acute Rehab PT Goals Patient Stated Goal: to return home PT Goal Formulation: With patient/family Time For Goal Achievement: 11/26/14 Potential to Achieve Goals: Good    Frequency BID   Barriers to discharge        Co-evaluation               End of Session Equipment Utilized During Treatment: Gait belt Activity Tolerance: Patient limited by fatigue (Anxiety limits transfer/ambulation) Patient left: in chair;with call bell/phone within reach;with chair alarm set;with family/visitor present Nurse Communication: Mobility status         Time: 6659-9357 PT Time Calculation (min) (ACUTE ONLY): 41 min   Charges:     PT Treatments $Gait Training: 8-22 mins $Therapeutic Exercise: 8-22 mins $Therapeutic Activity: 8-22 mins   PT G CodesJanyth Contes 2014-10-19, 2:16 PM  Janyth Contes, SPT. [phone]

## 2014-10-12 NOTE — Progress Notes (Signed)
Physical Therapy Treatment Patient Details Name: Yolanda Taylor MRN: [id number] DOB: 29-Jul-1930 Today's Date: 10/12/2014    History of Present Illness This patient is an 81 year old female who fell and suffered a L hip fracture. She recieved an ORIF repair    PT Comments    Pt continues with anxiety although lessening with encouragement. Continues to pivot on RLE versus stepping for transfers. Performed exercises well with assist as needed. Encouraged to continue with low level strengthening and active range of motion exercises throughout the day. Pt prefers to stay up in chair a little while longer to allow for bath. Continue PT to progress functional mobility.   Follow Up Recommendations  SNF     Equipment Recommendations  Rolling walker with 5" wheels    Recommendations for Other Services       Precautions / Restrictions Precautions Precautions: Fall Restrictions Weight Bearing Restrictions: Yes LLE Weight Bearing: Partial weight bearing LLE Partial Weight Bearing Percentage or Pounds: 50    Mobility  Bed Mobility Overal bed mobility:  (Not tested up in chair) Bed Mobility: Supine to Sit     Supine to sit: Max assist;+2 for physical assistance     General bed mobility comments: Pt requires assist for bodyweight support and management of LEs. Pt reports lots of pain with any mobility   Transfers Overall transfer level: Needs assistance Equipment used: Rolling walker (2 wheeled) Transfers: Sit to/from Stand Sit to Stand: Mod assist;+2 physical assistance         General transfer comment: Adjusted rw for appropriate height; pt pivots RLE versus stepping. Transferred to/from bedside commode  Ambulation/Gait Ambulation/Gait assistance: Mod assist Ambulation Distance (Feet): 3 Feet Assistive device: Rolling walker (2 wheeled) Gait Pattern/deviations: Decreased step length - right;Decreased step length - left;Step-to pattern;Scissoring Gait velocity:  decreased   General Gait Details: Pivots on R foot    Stairs            Wheelchair Mobility    Modified Rankin (Stroke Patients Only)       Balance Overall balance assessment: History of Falls (History of falls)                                  Cognition Arousal/Alertness: Awake/alert Behavior During Therapy: WFL for tasks assessed/performed Overall Cognitive Status: Within Functional Limits for tasks assessed                      Exercises Total Joint Exercises Ankle Circles/Pumps: AROM;10 reps;Both Quad Sets: Strengthening;Both;20 reps;Seated Gluteal Sets: Strengthening;Both;20 reps;Seated Short Arc Quad: AROM;Left;20 reps;Seated Heel Slides: AAROM;Left;20 reps;Seated (R AROM) Hip ABduction/ADduction: AAROM;Left;20 reps;Seated (R AROM) Straight Leg Raises: AROM;10 reps;Both Other Exercises Other Exercises: Chair pushup 10x    General Comments        Pertinent Vitals/Pain Pain Assessment: 0-10 Pain Score: 4  Pain Location: L hip Pain Intervention(s): Limited activity within patient's tolerance;Monitored during session;RN gave pain meds during session    [place] expects to be discharged to:: Private residence Living Arrangements: Alone Available Help at Discharge: Family Type of Home: House Home Access: Stairs to enter Entrance Stairs-Rails: None Home Layout: One level   Additional Comments: Daughter does not live far away and can provide assistance.     Prior Function Level of Independence: Independent          PT Goals (current goals can now be found in the care  plan section) Acute Rehab PT Goals Patient Stated Goal: to return home PT Goal Formulation: With patient/family Time For Goal Achievement: 11/26/14 Potential to Achieve Goals: Good Progress towards PT goals: Progressing toward goals    Frequency  BID    PT Plan Current plan remains appropriate    Co-evaluation             End of  Session Equipment Utilized During Treatment: Gait belt Activity Tolerance: Patient limited by fatigue (Anxiety limits transfer/ambulation) Patient left: in chair;with call bell/phone within reach;with chair alarm set;with family/visitor present     Time: 1749-4496 PT Time Calculation (min) (ACUTE ONLY): 41 min  Charges:  $Gait Training: 8-22 mins $Therapeutic Exercise: 8-22 mins $Therapeutic Activity: 8-22 mins                    G Codes:      Charlaine Dalton 10/12/2014, 2:13 PM

## 2014-10-12 NOTE — Clinical Documentation Improvement (Signed)
Possible Clinical Conditions?    Expected Acute Blood Loss Anemia  Acute Blood Loss Anemia  Acute on chronic blood loss anemia  Chronic blood loss anemia  Precipitous drop in Hematocrit  Other Condition________________  Cannot Clinically Determine   Risk Factors:Surgery   Supporting Information: Lab reported the following hemoglobin values for this patient, beginning at admission: 13.5, 12.7, 10.8. Patient had ORIF left femur fracture on 10/11/2014. H&P states: "No anemia, easy bruising or bleeding" Progress Note 10/11/2014 states: "watch for blood loss anemia".  Please document your response in progress notes and discharge summary.  Thank you, Carrolyn Meiers, RN [email or web address] [phone]

## 2014-10-12 NOTE — Evaluation (Signed)
Occupational Therapy Evaluation Patient Details Name: Yolanda Taylor MRN: [id number] DOB: 12/27/1930 Today's Date: 10/12/2014    History of Present Illness This patient is an 81 year old female who fell and suffered a L hip fracture. She recieved an ORIF repair   Clinical Impression   This patient is an 81 year old female who fell and suffered a L hip fracture. She received an open reduction with internal fixation repair.    Follow Up Recommendations  SNF    Equipment Recommendations       Recommendations for Other Services       Precautions / Restrictions Precautions Precautions: Fall Restrictions Weight Bearing Restrictions: Yes LLE Weight Bearing: Partial weight bearing LLE Partial Weight Bearing Percentage or Pounds: 50      Mobility Bed Mobility  Transfers    Balance                                          ADL                                         General ADL Comments: Had been independent including driving, now needs assist. She practiced techniques for lower body dressing to Donned/doffed socks and pants to knees.      Vision     Perception     Praxis      Pertinent Vitals/Pain      Hand Dominance     Extremity/Trunk Assessment Upper Extremity Assessment Upper Extremity Assessment: Overall WFL for tasks assessed         Communication Communication Communication: No difficulties   Cognition Arousal/Alertness: Awake/alert Behavior During Therapy: WFL for tasks assessed/performed Overall Cognitive Status: Within Functional Limits for tasks assessed                     General Comments       Exercises    Shoulder Instructions      Home Living Family/patient expects to be discharged to:: Private residence Living Arrangements: Alone Available Help at Discharge: Family Type of Home: House Home Access: Stairs to enter Technical brewer of Steps: 2 Entrance Stairs-Rails:  None Home Layout: One level                   Additional Comments: Daughter does not live far away and can provide assistance.       Prior Functioning/Environment Level of Independence: Independent             OT Diagnosis: Generalized weakness;Acute pain   OT Problem List:     OT Treatment/Interventions:      OT Goals(Current goals can be found in the care plan section) Acute Rehab OT Goals Patient Stated Goal: to return home  OT Frequency:     Barriers to D/C:            Co-evaluation              End of Session Equipment Utilized During Treatment:  (hip kit)  Activity Tolerance: Patient tolerated treatment well Patient left: in chair;with call bell/phone within reach;with chair alarm set;with family/visitor present   Time: 2703-5009 OT Time Calculation (min): 24 min Charges:  OT General Charges $OT Visit: 1 Procedure OT Evaluation $Initial OT Evaluation Tier I: 1 Procedure OT  Treatments $Self Care/Home Management : 8-22 mins G-Codes:    Myrene Galas, MS/OTR/L  10/12/2014, 1:30 PM

## 2014-10-12 NOTE — Progress Notes (Signed)
Iv occluded . rn attempted to to restart iv. Unsuccessful. Dr Vianne Bulls notiifed for picc placement

## 2014-10-12 NOTE — Evaluation (Signed)
Physical Therapy Evaluation Patient Details Name: Yolanda Taylor MRN: [id number] DOB: September 14, 1930 Today's Date: 10/12/2014   History of Present Illness  This patient is an 81 year old female who fell and suffered a L hip fracture. She recieved an ORIF repair  Clinical Impression  Pt presents with hx of cancer, HTN, and COPD. Examination reveals that pt has a great fear of her pain and consequently is afraid to move. However, she will perform mobility after encouragement. She ambulates with mod assist, transfers with max assist +2, and performs bed mobility +2. Her primary physical deficits include decreased strength, ROM, activity tolerance and acute pain. She will benefit from skilled PT to address these deficits for eventual safe return home. Pt's O2 sats resting on room air were 92 %. All mobility was performed on room air and pt's O2 never fell below 90%. She returned to mid 90s shortly after mobility, so was taken off O2. Nurse was notified of vitals and that pt was off O2.     Follow Up Recommendations SNF    Equipment Recommendations  Rolling walker with 5" wheels    Recommendations for Other Services       Precautions / Restrictions Precautions Precautions: Fall Restrictions Weight Bearing Restrictions: Yes LLE Weight Bearing: Partial weight bearing LLE Partial Weight Bearing Percentage or Pounds: 50      Mobility  Bed Mobility Overal bed mobility: Needs Assistance;+2 for physical assistance Bed Mobility: Supine to Sit     Supine to sit: Max assist;+2 for physical assistance     General bed mobility comments: Pt requires assist for bodyweight support and management of LEs. Pt reports lots of pain with any mobility   Transfers Overall transfer level: Needs assistance Equipment used: Rolling walker (2 wheeled) Transfers: Sit to/from Stand Sit to Stand: Max assist;+2 physical assistance         General transfer comment: Pt requires heavy lifting support  from therapist and lots of cues for hand placement prior to transfer. She needs cues to lift chest and press through her hands to get to full upright  Ambulation/Gait Ambulation/Gait assistance: Mod assist Ambulation Distance (Feet): 3 Feet Assistive device: Rolling walker (2 wheeled) Gait Pattern/deviations: Decreased step length - right;Decreased step length - left;Step-to pattern;Scissoring Gait velocity: decreased   General Gait Details: Pt needs assist for tactile cues for leg movement guiding her RW. She tends to get outside of her walker and to the side. Her hand placement on the walker needs to be corrected as well, as she tends to lean and pull on the handle bars rather than pushing through them  Stairs            Wheelchair Mobility    Modified Rankin (Stroke Patients Only)       Balance Overall balance assessment: History of Falls (2 in past 6 months)                                           Pertinent Vitals/Pain Pain Assessment: 0-10 Pain Score: 8  Pain Location: L hip Pain Intervention(s): Limited activity within patient's tolerance;Monitored during session;Utilized relaxation techniques    Home Living Family/patient expects to be discharged to:: Private residence Living Arrangements: Alone Available Help at Discharge: Family Type of Home: House Home Access: Stairs to enter Entrance Stairs-Rails: None Entrance Stairs-Number of Steps: 2 Home Layout: One level   Additional  Comments: Daughter does not live far away and can provide assistance.     Prior Function Level of Independence: Independent               Hand Dominance        Extremity/Trunk Assessment   Upper Extremity Assessment: Overall WFL for tasks assessed           Lower Extremity Assessment: LLE deficits/detail   LLE Deficits / Details: decreased strength, ROM, and acute pain     Communication   Communication: No difficulties  Cognition  Arousal/Alertness: Awake/alert Behavior During Therapy: WFL for tasks assessed/performed Overall Cognitive Status: Within Functional Limits for tasks assessed                      General Comments      Exercises Total Joint Exercises Ankle Circles/Pumps: AROM;10 reps;Both Quad Sets: AROM;10 reps;Both Gluteal Sets: 10 reps;AROM;Both Short Arc Quad: AROM;10 reps;Both Hip ABduction/ADduction: AROM;10 reps;Both Straight Leg Raises: AROM;10 reps;Both Other Exercises Other Exercises: Pt was assisted with proper technique for rolling and bed mobility to use bed pan x 10 min. Requires lots of reaching cues.       Assessment/Plan    PT Assessment Patient needs continued PT services  PT Diagnosis Difficulty walking;Abnormality of gait;Generalized weakness;Acute pain   PT Problem List Decreased strength;Decreased range of motion;Decreased activity tolerance;Decreased balance;Decreased mobility;Decreased knowledge of use of DME;Decreased safety awareness;Decreased knowledge of precautions;Pain  PT Treatment Interventions DME instruction;Gait training;Stair training;Functional mobility training;Therapeutic activities;Balance training;Therapeutic exercise;Neuromuscular re-education;Patient/family education   PT Goals (Current goals can be found in the Care Plan section) Acute Rehab PT Goals Patient Stated Goal: to return home PT Goal Formulation: With patient/family Time For Goal Achievement: 11/26/14 Potential to Achieve Goals: Good    Frequency BID   Barriers to discharge        Co-evaluation               End of Session Equipment Utilized During Treatment: Gait belt Activity Tolerance: Patient limited by pain Patient left: in chair;with call bell/phone within reach;with chair alarm set;with family/visitor present;with SCD's reapplied Nurse Communication: Mobility status         Time:  -[id number]     Charges:         PT G CodesJanyth Contes 2014-10-26, 1:23 PM Janyth Contes, SPT. [phone]

## 2014-10-13 ENCOUNTER — Encounter
Admission: RE | Admit: 2014-10-13 | Discharge: 2014-10-13 | Disposition: A | Discharge status: Home / Self Care | Payer: Medicare Other | Source: Ambulatory Visit | Attending: Internal Medicine | Admitting: Internal Medicine

## 2014-10-13 ENCOUNTER — Inpatient Hospital Stay: Payer: Medicare Other

## 2014-10-13 DIAGNOSIS — R3915 Urgency of urination: Secondary | ICD-10-CM | POA: Insufficient documentation

## 2014-10-13 DIAGNOSIS — R350 Frequency of micturition: Secondary | ICD-10-CM | POA: Insufficient documentation

## 2014-10-13 LAB — CBC
HCT: 32.3 % — ABNORMAL LOW (ref 35.0–47.0)
Hemoglobin: 10.7 g/dL — ABNORMAL LOW (ref 12.0–16.0)
MCH: 30.8 pg (ref 26.0–34.0)
MCHC: 33.1 g/dL (ref 32.0–36.0)
MCV: 93.2 fL (ref 80.0–100.0)
PLATELETS: 152 10*3/uL (ref 150–440)
RBC: 3.47 MIL/uL — ABNORMAL LOW (ref 3.80–5.20)
RDW: 13 % (ref 11.5–14.5)
WBC: 11.5 10*3/uL — ABNORMAL HIGH (ref 3.6–11.0)

## 2014-10-13 LAB — BASIC METABOLIC PANEL
Anion gap: 8 (ref 5–15)
BUN: 11 mg/dL (ref 6–20)
CHLORIDE: 103 mmol/L (ref 101–111)
CO2: 26 mmol/L (ref 22–32)
Calcium: 8 mg/dL — ABNORMAL LOW (ref 8.9–10.3)
Creatinine, Ser: 0.67 mg/dL (ref 0.44–1.00)
GLUCOSE: 118 mg/dL — AB (ref 65–99)
Potassium: 3.8 mmol/L (ref 3.5–5.1)
Sodium: 137 mmol/L (ref 135–145)

## 2014-10-13 MED ORDER — CEFTRIAXONE SODIUM 1 G IJ SOLR
1.0000 g | INTRAMUSCULAR | Status: DC
  Administered 2014-10-13 – 2014-10-14 (×2): 1 g via INTRAVENOUS
  Filled 2014-10-13 (×3): qty 10

## 2014-10-13 MED ORDER — CEFTRIAXONE SODIUM IN DEXTROSE 20 MG/ML IV SOLN: 1.0000 g | INTRAVENOUS | Status: DC

## 2014-10-13 MED ORDER — DIAZEPAM 5 MG PO TABS
5.0000 mg | ORAL_TABLET | Freq: Three times a day (TID) | ORAL | Status: DC | PRN
  Administered 2014-10-13 – 2014-10-14 (×2): 5 mg via ORAL
  Filled 2014-10-13 (×2): qty 1

## 2014-10-13 MED ORDER — ACETAMINOPHEN 325 MG PO TABS
650.0000 mg | ORAL_TABLET | ORAL | Status: DC | PRN
  Administered 2014-10-13: 650 mg via ORAL
  Filled 2014-10-13: qty 2

## 2014-10-13 NOTE — Progress Notes (Signed)
Plan is for patient to D/C to [facility] tomorrow 10/14/14. Kim admissions coordinator at [facility] is aware of above. Clinical Social Worker (CSW) will continue to follow and assist as needed.   Blima Rich, [place] [phone]

## 2014-10-13 NOTE — Progress Notes (Signed)
[facility] [place] NAME: Yolanda Taylor    MR#:  [id number]  DATE OF BIRTH:  1930-11-17  SUBJECTIVE: Patient seen today patient had fever 101 f this morning. She denies any complaints except the left hip pain. No sore throat, no cough .no hematuria.   CHIEF COMPLAINT:   Chief Complaint  Patient presents with  . Fall    REVIEW OF SYSTEMS:    Review of Systems  Constitutional: Negative for fever and chills.  HENT: Negative for hearing loss.   Eyes: Negative for blurred vision, double vision and photophobia.  Respiratory: Negative for cough, hemoptysis and shortness of breath.   Cardiovascular: Negative for palpitations, orthopnea and leg swelling.  Gastrointestinal: Negative for vomiting, abdominal pain and diarrhea.  Genitourinary: Negative for dysuria and urgency.  Musculoskeletal: Positive for joint pain. Negative for myalgias and neck pain.  Skin: Negative for rash.  Neurological: Negative for dizziness, focal weakness, seizures, weakness and headaches.  Psychiatric/Behavioral: Negative for memory loss. The patient does not have insomnia.     Nutrition:  Tolerating Diet: Tolerating PT:      DRUG ALLERGIES:   Allergies  Allergen Reactions  . Paroxetine Hcl Other (See Comments)    "Woozie"    VITALS:  Blood pressure 111/49, pulse 95, temperature 98.9 F (37.2 C), temperature source Oral, resp. rate 20, height 5\' 5"  (1.651 m), weight 71.442 kg (157 lb 8 oz), SpO2 93 %.  PHYSICAL EXAMINATION:   Physical Exam  GENERAL:  81 y.o.-year-old patient lying in the bed with no acute distress.  EYES: Pupils equal, round, reactive to light and accommodation. No scleral icterus. Extraocular muscles intact.  HEENT: Head atraumatic, normocephalic. Oropharynx and nasopharynx clear.  NECK:  Supple, no jugular venous distention. No thyroid enlargement, no tenderness.  LUNGS: Normal breath sounds bilaterally, no wheezing,  rales,rhonchi or crepitation. No use of accessory muscles of respiration.  CARDIOVASCULAR: S1, S2 normal. No murmurs, rubs, or gallops.  ABDOMEN: Soft, nontender, nondistended. Bowel sounds present. No organomegaly or mass.  EXTREMITIES: Left the hip surgery status post dressing present strength 5/5 in all extremities. Sensation intact. Gait not checked.  PSYCHIATRIC: The patient is alert and oriented x 3.  SKIN: No obvious rash, lesion, or ulcer.    LABORATORY PANEL:   CBC  Recent Labs Lab 10/13/14 0546  WBC 11.5*  HGB 10.7*  HCT 32.3*  PLT 152   ------------------------------------------------------------------------------------------------------------------  Chemistries   Recent Labs Lab 10/13/14 0546  NA 137  K 3.8  CL 103  CO2 26  GLUCOSE 118*  BUN 11  CREATININE 0.67  CALCIUM 8.0*   ------------------------------------------------------------------------------------------------------------------  Cardiac Enzymes No results for input(s): TROPONINI in the last 168 hours. ------------------------------------------------------------------------------------------------------------------  RADIOLOGY:  Dg C-arm 61-120 Min-no Report  10/11/2014   CLINICAL DATA: Fracture   C-ARM 61-120 MINUTES  Fluoroscopy was utilized by the requesting physician.  No radiographic  interpretation.    Dg Femur Port Min 2 Views Left  10/11/2014   CLINICAL DATA:  Postop  EXAM: LEFT FEMUR PORTABLE 2 VIEWS  COMPARISON:  10/10/2014  FINDINGS: Intra medullary rod and femoral neck screw now transfix a proximal femur diaphysis fracture. There is 1 distal interlocking screw. Anatomic alignment. No breakage or loosening of the hardware.  IMPRESSION: ORIF left femur fracture.   Electronically Signed   By: Marybelle Killings M.D.   On: 10/11/2014 21:24     ASSESSMENT AND PLAN:   Active Problems:   Femoral condyle fracture  1. Fall with the left femur fracture: Status post surgery, continue pain  medicine, nausea medicine, DVT prophylaxis with Lovenox, physical therapy recommends SNF. t. Continue incentive spirometry #2 COPD stable, no wheezing. #3 hypertension stable #4 history of breast cancer in remission. #5 fever rule out UTI and also pneumonia urine cultures and chest x-ray are ordered and started empirically on now IV Rocephin. Patient complains of left hip pain although follow-up is the appreciated.     All the records are reviewed and case discussed with Care Management/Social Workerr. Management plans discussed with the patient, family and they are in agreement.  CODE STATUS: full  TOTAL TIME TAKING CARE OF THIS PATIENT: 34minutes.   POSSIBLE D/C IN 1-2 DAYS, DEPENDING ON CLINICAL CONDITION.   Epifanio Lesches M.D on 10/13/2014 at 8:50 AM  Between 7am to 6pm - Pager - [phone]  After 6pm go to [email or web address] - password [email or web address] [facility]  Office  [phone]  CC: Primary care physician; Dion Body, MD

## 2014-10-13 NOTE — Progress Notes (Signed)
Physical Therapy Treatment Patient Details Name: Yolanda Taylor MRN: [id number] DOB: 04-17-1930 Today's Date: 10/13/2014    History of Present Illness This patient is an 81 year old female who fell and suffered a L hip fracture. She recieved an ORIF repair    PT Comments      Pt making gradual progress towards goals, but still exhibits anxiety towards mobility, especially ambulation. She needs lots of encouragement and reassurances that walking is both safe to do and important. Pt continues to need skilled PT in order to address her strength, ROM, activity tolerance, and acute pain deficits in order for her eventual safe return home. Pt's O2 sat on room air at rest was 92% and immediately after ambulation and transfer to chair her O2 sat was stil at 92%.         Follow Up Recommendations  SNF     Equipment Recommendations  Rolling walker with 5" wheels    Recommendations for Other Services       Precautions / Restrictions Precautions Precautions: Fall Restrictions Weight Bearing Restrictions: Yes LLE Weight Bearing: Partial weight bearing LLE Partial Weight Bearing Percentage or Pounds: 50    Mobility  Bed Mobility Overal bed mobility: Needs Assistance;+2 for physical assistance Bed Mobility: Supine to Sit     Supine to sit: Mod assist;+2 for physical assistance     General bed mobility comments: Pt bed mobility improved this date. Still requires assist for body weight support and LE management, but pt better able to actively contribute to leg movement.   Transfers Overall transfer level: Needs assistance Equipment used: Rolling walker (2 wheeled) Transfers: Sit to/from Stand Sit to Stand: Mod assist;+2 physical assistance         General transfer comment: Pt needs bodyweight support for lifting into upright position. She also needs lots of VC on hand palcement prior to transfer, as well as to lift her chest and push through her hands to complete  transfer.   Ambulation/Gait Ambulation/Gait assistance: Mod assist Ambulation Distance (Feet): 6 Feet Assistive device: Rolling walker (2 wheeled) Gait Pattern/deviations: Step-to pattern;Decreased step length - right;Decreased step length - left;Shuffle;Antalgic Gait velocity: decreased   General Gait Details: Pt needs physical assist and VC to progress her walker and continue to step with her right leg. She tends to not advance right LE with weight bearing on the left. Pt encouraged to continue to push through hands to maintain PWB status.     Stairs            Wheelchair Mobility    Modified Rankin (Stroke Patients Only)       Balance Overall balance assessment: History of Falls                                  Cognition Arousal/Alertness: Awake/alert Behavior During Therapy: WFL for tasks assessed/performed Overall Cognitive Status: Within Functional Limits for tasks assessed                      Exercises Total Joint Exercises Ankle Circles/Pumps: AROM;Both;15 reps Quad Sets: AROM;Both;15 reps Gluteal Sets: AROM;15 reps;Both Heel Slides: AROM;10 reps;Both Hip ABduction/ADduction: AROM;15 reps;Both Straight Leg Raises: AROM;15 reps;Both Long Arc Quad: AROM;15 reps;Both    General Comments        Pertinent Vitals/Pain Pain Assessment: 0-10 Pain Score: 6  Pain Location: left hip Pain Intervention(s): Limited activity within patient's tolerance;Monitored during session;Premedicated before  session;Utilized relaxation techniques    Home Living                      Prior Function            PT Goals (current goals can now be found in the care plan section) Acute Rehab PT Goals Patient Stated Goal: Patient would rather go home but willing to go to SNF PT Goal Formulation: With patient/family Time For Goal Achievement: 11/26/14 Potential to Achieve Goals: Good Progress towards PT goals: Progressing toward goals     Frequency  BID    PT Plan Current plan remains appropriate    Co-evaluation             End of Session Equipment Utilized During Treatment: Gait belt Activity Tolerance: Patient limited by fatigue (Pt limited by anxiety and fear of falling) Patient left: in chair;with call bell/phone within reach;with chair alarm set     Time: 3419-6222 PT Time Calculation (min) (ACUTE ONLY): 24 min  Charges:                       G CodesJanyth Contes 2014/11/09, 1:48 PM Janyth Contes, SPT. [phone]

## 2014-10-13 NOTE — Progress Notes (Signed)
Physical Therapy Treatment Patient Details Name: Yolanda Taylor MRN: [id number] DOB: Apr 26, 1930 Today's Date: 10/13/2014    History of Present Illness This patient is an 81 year old female who fell and suffered a L hip fracture. She recieved an ORIF repair    PT Comments    Pt making gradual progress towards goals, but still exhibits anxiety towards mobility, especially ambulation. She needs lots of encouragement and reassurances that walking is both safe to do and important. Pt continues to need skilled PT in order to address her strength, ROM, activity tolerance, and acute pain deficits in order for her eventual safe return home.   Follow Up Recommendations  SNF     Equipment Recommendations  Rolling walker with 5" wheels    Recommendations for Other Services       Precautions / Restrictions Precautions Precautions: Fall Restrictions Weight Bearing Restrictions: Yes LLE Weight Bearing: Partial weight bearing LLE Partial Weight Bearing Percentage or Pounds: 50    Mobility  Bed Mobility Overal bed mobility: Needs Assistance;+2 for physical assistance Bed Mobility: Supine to Sit     Supine to sit: Mod assist;+2 for physical assistance     General bed mobility comments: Pt bed mobility improved this date. Still requires assist for body weight support and LE management, but pt better able to actively contribute to leg movement.   Transfers Overall transfer level: Needs assistance Equipment used: Rolling walker (2 wheeled) Transfers: Sit to/from Stand Sit to Stand: Mod assist;+2 physical assistance         General transfer comment: Pt needs bodyweight support for lifting into upright position. She also needs lots of VC on hand palcement prior to transfer, as well as to lift her chest and push through her hands to complete transfer.   Ambulation/Gait Ambulation/Gait assistance: Mod assist Ambulation Distance (Feet): 6 Feet Assistive device: Rolling walker (2  wheeled) Gait Pattern/deviations: Step-to pattern;Decreased step length - right;Decreased step length - left;Shuffle;Antalgic Gait velocity: decreased   General Gait Details: Pt needs physical assist and VC to progress her walker and continue to step with her right leg. She tends to not advance right LE with weight bearing on the left. Pt encouraged to continue to push through hands to maintain PWB status.     Stairs            Wheelchair Mobility    Modified Rankin (Stroke Patients Only)       Balance Overall balance assessment: History of Falls                                  Cognition Arousal/Alertness: Awake/alert Behavior During Therapy: WFL for tasks assessed/performed Overall Cognitive Status: Within Functional Limits for tasks assessed                      Exercises Total Joint Exercises Ankle Circles/Pumps: AROM;Both;15 reps Quad Sets: AROM;Both;15 reps Gluteal Sets: AROM;15 reps;Both Heel Slides: AROM;10 reps;Both Hip ABduction/ADduction: AROM;15 reps;Both Straight Leg Raises: AROM;15 reps;Both Long Arc Quad: AROM;15 reps;Both    General Comments        Pertinent Vitals/Pain Pain Assessment: 0-10 Pain Score: 6  Pain Location: L hip Pain Intervention(s): Limited activity within patient's tolerance;Monitored during session;Premedicated before session;Utilized relaxation techniques    Home Living                      Prior Function  PT Goals (current goals can now be found in the care plan section) Acute Rehab PT Goals Patient Stated Goal: to return home PT Goal Formulation: With patient/family Time For Goal Achievement: 11/26/14 Potential to Achieve Goals: Good Progress towards PT goals: Progressing toward goals    Frequency  BID    PT Plan Current plan remains appropriate    Co-evaluation             End of Session Equipment Utilized During Treatment: Gait belt Activity Tolerance:  Patient limited by fatigue (Pt limited by anxiety and fear of falling) Patient left: in chair;with call bell/phone within reach;with chair alarm set     Time: 6553-7482 PT Time Calculation (min) (ACUTE ONLY): 24 min  Charges:                       G CodesJanyth Contes 2014/10/17, 12:49 PM  Janyth Contes, SPT. [phone]

## 2014-10-13 NOTE — Progress Notes (Signed)
Subjective: 2 Days Post-Op Procedure(s) (LRB): intermedullary nailing of left subtrocanteric  femur  fracture (Left)    Patient reports pain as mild. Alert and oriented.  Walked with PT today. hgb stable.  Dressings dry  Objective:   VITALS:   Filed Vitals:   10/13/14 1535  BP: 118/52  Pulse: 89  Temp: 98.6 F (37 C)  Resp: 18    Neurologically intact Sensation intact distally Intact pulses distally Dorsiflexion/Plantar flexion intact Compartment soft  LABS  Recent Labs  10/11/14 0459 10/12/14 0424 10/13/14 0546  HGB 12.7 10.8* 10.7*  HCT 37.3 33.0* 32.3*  WBC 13.0* 11.3* 11.5*  PLT 203 154 152     Recent Labs  10/11/14 0459 10/12/14 0424 10/13/14 0546  NA 140 137 137  K 4.0 3.9 3.8  BUN 15 10 11   CREATININE 0.67 0.65 0.67  GLUCOSE 139* 126* 118*     Recent Labs  10/10/14 2041 10/11/14 0459  INR 0.92 0.99     Assessment/Plan: 2 Days Post-Op Procedure(s) (LRB): intermedullary nailing of left subtrocanteric  femur  fracture (Left)   Discharge to SNF soon.   Continue PT

## 2014-10-13 NOTE — Progress Notes (Signed)
Physical Therapy Treatment Patient Details Name: Yolanda Taylor MRN: [id number] DOB: 21-Dec-1930 Today's Date: 10/13/2014    History of Present Illness This patient is an 81 year old female who fell and suffered a L hip fracture. She recieved an ORIF repair    PT Comments    After lots of encouragement and discussion with pt, she performed much better this PM with ambulation, walking 20 ft with RW. Still requires a great amount of cues, but pt able to manage pain and walker sequence better. Due to her pain, ROM, and acute pain, pt will continue to benefit from skilled PT in order for eventual safe return home.  O2sat resting on room air: 91% O2sat immediately following mobility on room air: 92%  Follow Up Recommendations  SNF     Equipment Recommendations  Rolling walker with 5" wheels    Recommendations for Other Services       Precautions / Restrictions Precautions Precautions: Fall Restrictions Weight Bearing Restrictions: Yes LLE Weight Bearing: Partial weight bearing LLE Partial Weight Bearing Percentage or Pounds: 50    Mobility  Bed Mobility Overal bed mobility: Needs Assistance;+2 for physical assistance Bed Mobility: Sit to Supine     Supine to sit: Mod assist;+2 for physical assistance     General bed mobility comments: Pt bed mobility improved this date. Still requires assist for body weight support and LE management, but pt better able to actively contribute to leg movement.  (c/o slightly more pain this PM)  Transfers Overall transfer level: Needs assistance Equipment used: Rolling walker (2 wheeled) Transfers: Sit to/from Stand Sit to Stand: Mod assist;+2 physical assistance         General transfer comment: Pt needs bodyweight support for lifting into upright position. She also needs lots of VC on hand palcement prior to transfer, as well as to lift her chest and push through her hands to complete transfer.  (Less time required to complete  transfer this PM)  Ambulation/Gait Ambulation/Gait assistance: Min assist Ambulation Distance (Feet): 20 Feet Assistive device: Rolling walker (2 wheeled) Gait Pattern/deviations: Step-to pattern;Decreased step length - right;Decreased step length - left;Shuffle Gait velocity: decreased   General Gait Details: Distance ambulated improved this PM after lots of encouragement to try ambulation after pt stating that she cannot. Needs physical assist and VC to progress her walker and continue to step with her right leg. She tends to not advance right LE with weight bearing on the left. Pt encouraged to continue to push through hands to maintain PWB status.     Stairs            Wheelchair Mobility    Modified Rankin (Stroke Patients Only)       Balance Overall balance assessment: History of Falls                                  Cognition Arousal/Alertness: Awake/alert Behavior During Therapy: WFL for tasks assessed/performed Overall Cognitive Status: Within Functional Limits for tasks assessed                      Exercises Total Joint Exercises Ankle Circles/Pumps: AROM;Both;15 reps Quad Sets: AROM;Both;15 reps Gluteal Sets: AROM;15 reps;Both Heel Slides: AROM;10 reps;Both Hip ABduction/ADduction: AROM;15 reps;Both Straight Leg Raises: AROM;15 reps;Both Long Arc Quad: AROM;15 reps;Both    General Comments        Pertinent Vitals/Pain Pain Assessment: 0-10 Pain Score:  7  Pain Location: L hip Pain Intervention(s): Limited activity within patient's tolerance;Monitored during session;Premedicated before session;Utilized relaxation techniques    Home Living                      Prior Function            PT Goals (current goals can now be found in the care plan section) Acute Rehab PT Goals Patient Stated Goal: try to do something with therapy PT Goal Formulation: With patient/family Time For Goal Achievement: 11/26/14 Potential  to Achieve Goals: Good Progress towards PT goals: Progressing toward goals    Frequency  BID    PT Plan Current plan remains appropriate    Co-evaluation             End of Session Equipment Utilized During Treatment: Gait belt Activity Tolerance: Patient tolerated treatment well (Better this PM after encouragement ) Patient left: in bed;with call bell/phone within reach;with bed alarm set;with family/visitor present;with SCD's reapplied     Time: 9476-5465 PT Time Calculation (min) (ACUTE ONLY): 40 min  Charges:                       G CodesJanyth Contes 10-26-2014, 3:32 PM  Janyth Contes, SPT. [phone]

## 2014-10-13 NOTE — Progress Notes (Signed)
Suppository given, no complants of pain, intake and output good. Family at bedside.

## 2014-10-13 NOTE — Progress Notes (Signed)
   10/13/14 2200  Clinical Encounter Type  Visited With Patient  Visit Type Spiritual support  Spiritual Encounters  Spiritual Needs Grief support  Stress Factors  Patient Stress Factors Health changes   Status: Femoral condyle fracture, feeling little cold Age/Sex: 4 female Visit Assessment: The patient was given an extra blanket as per her request. She shared that her upper left leg hurt and that she felt better yesterday. She said her sister and hubby brought balloons and left when she needed to use bed pan. The chaplain offered silent prayers and encouraging words for comfort and healing.  Pastoral care can be reached via pager [phone] or by submitting an online request. Alert and oriented

## 2014-10-13 NOTE — Progress Notes (Signed)
Occupational Therapy Treatment Patient Details Name: Yolanda Taylor MRN: [id number] DOB: April 10, 1930 Today's Date: 10/13/2014    History of present illness This patient is an 81 year old female who fell and suffered a L hip fracture. She recieved an ORIF repair   OT comments  Patient limited by pain in activity. Has had all pain medicine.  Had some nausea, RN away.  Follow Up Recommendations  SNF    Equipment Recommendations       Recommendations for Other Services      Precautions / Restrictions Precautions Precautions: Fall Restrictions Weight Bearing Restrictions: Yes LLE Weight Bearing: Partial weight bearing LLE Partial Weight Bearing Percentage or Pounds: 50       Mobility Bed Mobility      Transfers            Balance                                 ADL  Patient had no real clothing so practiced techniques for lower body dressing using hip kit with moderate assist (pants only to knees.                                              Vision                     Perception     Praxis      Cognition   Behavior During Therapy: [facility] for tasks assessed/performed Overall Cognitive Status: Within Functional Limits for tasks assessed                       Extremity/Trunk Assessment               Exercises Total Joint Exercises Ankle Circles/Pumps: AROM;Both;15 reps Quad Sets: AROM;Both;15 reps Gluteal Sets: AROM;15 reps;Both Heel Slides: AROM;10 reps;Both Hip ABduction/ADduction: AROM;15 reps;Both Straight Leg Raises: AROM;15 reps;Both Long Arc Quad: AROM;15 reps;Both   Shoulder Instructions       General Comments      Pertinent Vitals/ Pain       Pain Assessment: 0-10 Pain Score: 6  Pain Location: left hip Pain Intervention(s): Limited activity within patient's tolerance;Monitored during session;Premedicated before session.  Home Living                                          Prior Functioning/Environment              Frequency       Progress Toward Goals  OT Goals(current goals can now be found in the care plan section)  Progress towards OT goals: Progressing toward goals (slowly)  Acute Rehab OT Goals Patient Stated Goal: Patient would rather go home but willing to go to SNF  Plan      Co-evaluation                 End of Session Equipment Utilized During Treatment:  (hip kit)   Activity Tolerance Patient limited by pain   Patient Left in chair;with call bell/phone within reach;with chair alarm set;with family/visitor present   Nurse Communication          Time: 7001-7494 OT Time  Calculation (min): 18 min  Charges: OT General Charges $OT Visit: 1 Procedure OT Treatments $Self Care/Home Management : 8-22 mins  Myrene Galas, MS/OTR/L  10/13/2014, 1:22 PM

## 2014-10-13 NOTE — Care Management Important Message (Signed)
Important Message  Patient Details  Name: Yolanda Taylor MRN: [id number] Date of Birth: 09/16/1930   Medicare Important Message Given:  Yes-second notification given    Juliann Pulse A Allmond 10/13/2014, 10:21 AM

## 2014-10-14 LAB — CBC
HCT: 31.4 % — ABNORMAL LOW (ref 35.0–47.0)
HEMOGLOBIN: 10.6 g/dL — AB (ref 12.0–16.0)
MCH: 31.3 pg (ref 26.0–34.0)
MCHC: 33.8 g/dL (ref 32.0–36.0)
MCV: 92.4 fL (ref 80.0–100.0)
PLATELETS: 155 10*3/uL (ref 150–440)
RBC: 3.4 MIL/uL — AB (ref 3.80–5.20)
RDW: 13 % (ref 11.5–14.5)
WBC: 11.5 10*3/uL — AB (ref 3.6–11.0)

## 2014-10-14 MED ORDER — DIAZEPAM 5 MG PO TABS: 5.0000 mg | ORAL_TABLET | Freq: Three times a day (TID) | ORAL | Status: AC | PRN

## 2014-10-14 MED ORDER — FERROUS SULFATE 325 (65 FE) MG PO TABS: 325.0000 mg | ORAL_TABLET | Freq: Two times a day (BID) | ORAL | Status: AC

## 2014-10-14 MED ORDER — CIPROFLOXACIN HCL 500 MG PO TABS: 500.0000 mg | ORAL_TABLET | Freq: Two times a day (BID) | ORAL | Status: DC

## 2014-10-14 MED ORDER — HYDROCODONE-ACETAMINOPHEN 5-325 MG PO TABS: 1.0000 | ORAL_TABLET | Freq: Four times a day (QID) | ORAL | Status: DC | PRN

## 2014-10-14 MED ORDER — ENOXAPARIN SODIUM 40 MG/0.4ML ~~LOC~~ SOLN: 40.0000 mg | SUBCUTANEOUS | Status: DC

## 2014-10-14 NOTE — Progress Notes (Signed)
Checked to see if pre-authorization would be needed for non-emergent EMS transport.  Per [employer] rep Yolanda Taylor), [phone], patient has an [facility] Complete Choice PPO policy.  [employer] Medicare PPO plans do not require pre-auth for non-emergent ground transports using service codes [phone] or [phone].

## 2014-10-14 NOTE — Discharge Summary (Addendum)
Yolanda Taylor, is a 81 y.o. female  DOB 10-29-1930  MRN [id number].  Admission date:  10/10/2014  Admitting Physician  Nicholes Mango, MD  Discharge Date:  10/14/2014   Primary MD  Dion Body, MD  Recommendations for primary care physician for things to follow:  Follow-up with Dr. Earnestine Leys in about 2 weeks. Follow-up with Dr. Netty Starring  in one week.   Admission Diagnosis  Hip pain, acute, left [M25.552] Femur fracture, left, closed, initial encounter [S72.92XA]   Discharge Diagnosis  Hip pain, acute, left [M25.552] Femur fracture, left, closed, initial encounter [S72.92XA]    Active Problems:   Femoral condyle fracture      Past Medical History  Diagnosis Date  . Cancer   . Hypertension   . COPD (chronic obstructive pulmonary disease)     Past Surgical History  Procedure Laterality Date  . Breast surgery    . Cholecystectomy    . Abdominal hysterectomy    . Eye surgery    . Hernia repair    . Intramedullary (im) nail intertrochanteric Left 10/11/2014    Procedure: intermedullary nailing of left subtrocanteric  femur  fracture;  Surgeon: Earnestine Leys, MD;  Location: [facility] ORS;  Service: Orthopedics;  Laterality: Left;       History of present illness and  Hospital Course:     Kindly see H&P for history of present illness and admission details, please review complete Labs, Consult reports and Test reports for all details in brief  HPI  from the history and physical done on the day of admission  81 year old female patient with hypertension, COPD, right breast cancer in remission comes in because of fall at home patient .x-ray of the left hip revealed the left femoral fracture. Patient is admitted  for this.  Hospital Course  #1 left femur fracture: In by Dr. Earnestine Leys, status post  ORIF on July 25. Patient tolerated the procedure well seen the physical therapy they recommended rehabilitation , discharge her to [facility], continue Lovenox, pain medications and follow up with Dr. Earnestine Leys in 1 week. #2 postoperative fever: Patient blood cultures negative urine cultures are negative chest x-ray negative for pneumonia. WBC on admission 15.2 came down to 11.5 today. She is getting Cipro empirically. #3 history of COPD patient has no wheezing or rales continue home medications.  #4: Hypertension well controlled. History of breast cancer in remission.  Acute blood loss anemia  Due to surgery;did not require transfusion  Discharge Condition:    Follow UP  Follow-up Information    Follow up with Dion Body, MD In 1 week.   Specialty:  Family Medicine   Contact information:   [place] [id number] [phone]       Follow up with MILLER,HOWARD E, MD In 2 weeks.   Specialty:  Specialist   Contact information:   [place] [id number] [phone]         Discharge Instructions  and  Discharge Medications        Medication List    TAKE these medications        acetaminophen 500 MG tablet  Commonly known as:  TYLENOL  Take 1 tablet by mouth every 6 (six) hours as needed.     ADVAIR DISKUS 250-50 MCG/DOSE Aepb  Generic drug:  Fluticasone-Salmeterol  Inhale 1 puff into the lungs 2 (two) times daily.     albuterol 108 (90 BASE) MCG/ACT inhaler  Commonly known as:  PROVENTIL HFA;VENTOLIN HFA  Inhale 2 puffs into the lungs every 4 (four) hours as needed for wheezing or shortness of breath.     aspirin 81 MG tablet  Take 81 mg by mouth daily.     CALCIUM 600 600 MG Tabs tablet  Generic drug:  calcium carbonate  Take 600 mg by mouth daily.     ciprofloxacin 500 MG tablet  Commonly known as:  CIPRO  Take 1 tablet (500 mg total) by mouth 2 (two) times daily.     enoxaparin 40 MG/0.4ML injection   Commonly known as:  LOVENOX  Inject 0.4 mLs (40 mg total) into the skin daily.     ferrous sulfate 325 (65 FE) MG tablet  Take 1 tablet (325 mg total) by mouth 2 (two) times daily with a meal.     fluticasone 50 MCG/ACT nasal spray  Commonly known as:  FLONASE  Place 2 sprays into both nostrils daily.     guaiFENesin 600 MG 12 hr tablet  Commonly known as:  MUCINEX  Take 600 mg by mouth 2 (two) times daily.     hydrochlorothiazide 25 MG tablet  Commonly known as:  HYDRODIURIL  Take 1 tablet by mouth daily.     HYDROcodone-acetaminophen 5-325 MG per tablet  Commonly known as:  NORCO/VICODIN  Take 1-2 tablets by mouth every 6 (six) hours as needed for moderate pain.     metoCLOPramide 5 MG tablet  Commonly known as:  REGLAN  Take 5 mg by mouth 2 (two) times daily.     omeprazole 20 MG capsule  Commonly known as:  PRILOSEC  Take 20 mg by mouth daily.     pantoprazole 40 MG tablet  Commonly known as:  PROTONIX  Take 1 tablet by mouth daily.     PROBIOTIC COLON SUPPORT PO  Take 1 capsule by mouth daily.     Vitamin D (Ergocalciferol) 50000 UNITS Caps capsule  Commonly known as:  DRISDOL  Take 1 capsule by mouth daily.          Diet and Activity recommendation: See Discharge Instructions above   Consults obtained - ortho   Major procedures and Radiology Reports - PLEASE review detailed and final reports for all details, in brief -     Dg Chest 1 View  10/13/2014   CLINICAL DATA:  Fever.  Recent admission for hip fracture.  EXAM: CHEST  1 VIEW  COMPARISON:  Chest radiograph 10/10/2014.  FINDINGS: Low lung volumes with bibasilar atelectasis greater on the LEFT. Small LEFT effusion. Retrocardiac density is increased, and early LEFT lower lobe infiltrate cannot be excluded. Osteopenia. Degenerative change both shoulders. Normal heart size. Significant worsening aeration from preoperative radiograph.  IMPRESSION: Worsening aeration. Retrocardiac density representing  atelectasis with or without superimposed infiltrate. Small effusion.   Electronically Signed   By: Staci Righter M.D.   On: 10/13/2014 09:23   Dg Chest 1 View  10/10/2014   CLINICAL DATA:  Preoperative  EXAM: CHEST  1 VIEW  COMPARISON:  05/12/2012  FINDINGS: There is hyperinflation and chronic appearing interstitial coarsening. There is no confluent airspace consolidation. There is no large effusion. There is no pneumothorax.  IMPRESSION: Hyperinflation.  Chronic appearing interstitial coarsening.   Electronically Signed   By: Andreas Newport M.D.   On: 10/10/2014 22:22   Dg C-arm 61-120 Min-no Report  10/11/2014   CLINICAL DATA: Fracture   C-ARM 61-120 MINUTES  Fluoroscopy was utilized by the requesting physician.  No radiographic  interpretation.    Dg Hip Unilat With Pelvis 2-3 Views Left  10/10/2014   CLINICAL DATA:  Fall now with left hip pain and tenderness about the proximal femur. Tripped over rug earlier this afternoon.  EXAM: DG HIP (WITH OR WITHOUT PELVIS) 2-3V LEFT  COMPARISON:  Abdominal radiograph 08/13/2012  FINDINGS: Displaced angulated fracture of the proximal femoral diaphysis. There is greater than 1 shaft width medial displacement of main femoral shaft and at least 3 cm osseous overriding. No definite intertrochanteric extension. The femoral head remains seated in the acetabulum. Pubic rami appear intact. Pelvic calcifications likely combination of phlebolith, vascular, and questionable fibroid, unchanged.  IMPRESSION: Displaced angulated proximal femoral diaphyseal fracture with osseous overriding. No definite extension into the intertrochanteric femur.   Electronically Signed   By: Jeb Levering M.D.   On: 10/10/2014 22:21   Dg Femur Port Min 2 Views Left  10/11/2014   CLINICAL DATA:  Postop  EXAM: LEFT FEMUR PORTABLE 2 VIEWS  COMPARISON:  10/10/2014  FINDINGS: Intra medullary rod and femoral neck screw now transfix a proximal femur diaphysis fracture. There is 1 distal  interlocking screw. Anatomic alignment. No breakage or loosening of the hardware.  IMPRESSION: ORIF left femur fracture.   Electronically Signed   By: Marybelle Killings M.D.   On: 10/11/2014 21:24    Micro Results     Recent Results (from the past 240 hour(s))  Surgical pcr screen     Status: None   Collection Time: 10/11/14  8:50 AM  Result Value Ref Range Status   MRSA, PCR NEGATIVE NEGATIVE Final   Staphylococcus aureus NEGATIVE NEGATIVE Final    Comment:        The Xpert SA Assay (FDA approved for NASAL specimens in patients over 33 years of age), is one component of a comprehensive surveillance program.  Test performance has been validated by [facility] for patients greater than or equal to 26 year old. It is not intended to diagnose infection nor to guide or monitor treatment.        Today   Subjective:   Yolanda Taylor today has no headache,no chest abdominal pain,no new weakness tingling or numbness, feels much better wants to go home today.   Objective:   Blood pressure 116/52, pulse 96, temperature 99.2 F (37.3 C), temperature source Oral, resp. rate 14, height 5\' 5"  (1.651 m), weight 71.442 kg (157 lb 8 oz), SpO2 94 %.   Intake/Output Summary (Last 24 hours) at 10/14/14 0804 Last data filed at 10/13/14 2230  Gross per 24 hour  Intake    560 ml  Output    600 ml  Net    -40 ml    Exam Awake Alert, Oriented x 3, No new F.N deficits, Normal affect [place].AT,PERRAL Supple Neck,No JVD, No cervical lymphadenopathy appriciated.  Symmetrical Chest wall movement, Good air movement bilaterally, CTAB RRR,No Gallops,Rubs or new Murmurs, No Parasternal Heave +ve B.Sounds, Abd Soft, Non tender, No organomegaly appriciated, No rebound -guarding or rigidity. No Cyanosis, Clubbing or edema, No new Rash or bruise  Data Review   CBC w Diff: Lab Results  Component Value Date   WBC 11.5* 10/14/2014   WBC 8.9 09/12/2011   HGB 10.6* 10/14/2014   HGB 14.1 09/12/2011    HCT 31.4* 10/14/2014   HCT 42.0 09/12/2011   PLT 155 10/14/2014   PLT 200 05/13/2012   LYMPHOPCT 6 10/10/2014   MONOPCT 5 10/10/2014   EOSPCT 0 10/10/2014   BASOPCT 0 10/10/2014  CMP: Lab Results  Component Value Date   NA 137 10/13/2014   NA 141 09/12/2011   K 3.8 10/13/2014   K 3.1* 09/12/2011   CL 103 10/13/2014   CL 102 09/12/2011   CO2 26 10/13/2014   CO2 30 09/12/2011   BUN 11 10/13/2014   BUN 19* 09/12/2011   CREATININE 0.67 10/13/2014   CREATININE 0.99 09/12/2011   PROT 6.5 09/12/2011   ALBUMIN 4.0 10/10/2014   ALBUMIN 3.6 09/12/2011   BILITOT 0.4 09/12/2011   ALKPHOS 66 09/12/2011   AST 20 09/12/2011   ALT 23 09/12/2011  .   Total Time in preparing paper work, data evaluation and todays exam - 29 minutes  Chaze Hruska M.D on 10/14/2014 at 8:04 AM

## 2014-10-14 NOTE — Clinical Social Work Placement (Signed)
   CLINICAL SOCIAL WORK PLACEMENT  NOTE  Date:  10/14/2014  Patient Details  Name: Yolanda Taylor MRN: [id number] Date of Birth: 03/30/1930  Clinical Social Work is seeking post-discharge placement for this patient at the [place] level of care (*CSW will initial, date and re-position this form in  chart as items are completed):  Yes   Patient/family provided with [facility] Work Department's list of facilities offering this level of care within the geographic area requested by the patient (or if unable, by the patient's family).  Yes   Patient/family informed of their freedom to choose among providers that offer the needed level of care, that participate in Medicare, Medicaid or managed care program needed by the patient, have an available bed and are willing to accept the patient.  Yes   Patient/family informed of [facility]'s ownership interest in [facility] and [facility], as well as of the fact that they are under no obligation to receive care at these facilities.  PASRR submitted to EDS on 10/11/14     PASRR number received on 10/11/14     Existing PASRR number confirmed on       FL2 transmitted to all facilities in geographic area requested by pt/family on 10/11/14     FL2 transmitted to all facilities within larger geographic area on       Patient informed that his/her managed care company has contracts with or will negotiate with certain facilities, including the following:        Yes   Patient/family informed of bed offers received.  Patient chooses bed at  [facility] )     Physician recommends and patient chooses bed at      Patient to be transferred to  [facility]) on 10/14/14.  Patient to be transferred to facility by  [facility] )     Patient family notified on 10/14/14 of transfer.  Name of family member notified:   (Attempted to contact patient's daughter [name]. )     PHYSICIAN        Additional Comment:    _______________________________________________ Loralyn Freshwater, LCSW 10/14/2014, 10:04 AM

## 2014-10-14 NOTE — Progress Notes (Signed)
Physical Therapy Treatment Patient Details Name: Yolanda Taylor MRN: [id number] DOB: [birth year] Today's Date: 10/14/2014    History of Present Illness This patient is an 81 year old female who fell and suffered a L hip fracture. She recieved an ORIF repair    PT Comments    Pt limited with mobility today secondary to complaints of pain. Pt remains very anxious and emotional about her hip operated hip. Therapeutic exercise and education was performed this AM with pt prior to her discharge. She will still benefit from skilled PT in order to address ROM, strength, and gait impairments in order to eventually return home safely.   Follow Up Recommendations  SNF     Equipment Recommendations  Rolling walker with 5" wheels    Recommendations for Other Services       Precautions / Restrictions Precautions Precautions: Fall Restrictions Weight Bearing Restrictions: Yes LLE Weight Bearing: Partial weight bearing LLE Partial Weight Bearing Percentage or Pounds: 50    Mobility  Bed Mobility               General bed mobility comments: Attempted bed mobility but pt in too much pain.   Transfers                 General transfer comment: Not attempted secondary to pain  Ambulation/Gait             General Gait Details: Not attempted secondary to pain   Stairs            Wheelchair Mobility    Modified Rankin (Stroke Patients Only)       Balance Overall balance assessment: History of Falls                                  Cognition Arousal/Alertness: Awake/alert Behavior During Therapy: Agitated Overall Cognitive Status: Within Functional Limits for tasks assessed                      Exercises Total Joint Exercises Ankle Circles/Pumps: AROM;Both;15 reps Quad Sets: AROM;Both;15 reps Gluteal Sets: AROM;15 reps;Both Short Arc Quad: AROM;15 reps;Both Heel Slides: AROM;10 reps;Both Hip ABduction/ADduction: AROM;15  reps;Both Straight Leg Raises: AROM;15 reps;Both Other Exercises Other Exercises: Pt was informed on proper technique with bed mobility with practice, as well as instructions on how to properly use RW in order to maintain WB x 10 min    General Comments        Pertinent Vitals/Pain Pain Assessment: 0-10 Pain Score: 9  Pain Location: L hip Pain Intervention(s): Limited activity within patient's tolerance;Monitored during session;Premedicated before session;Utilized relaxation techniques    Home Living                      Prior Function            PT Goals (current goals can now be found in the care plan section) Acute Rehab PT Goals Patient Stated Goal: None stated PT Goal Formulation: With patient/family Time For Goal Achievement: 11/26/14 Potential to Achieve Goals: Fair Progress towards PT goals: Progressing toward goals    Frequency  BID    PT Plan Current plan remains appropriate    Co-evaluation             End of Session Equipment Utilized During Treatment: Gait belt Activity Tolerance: Patient limited by pain Patient left: in bed;with call bell/phone within reach;with  SCD's reapplied;with bed alarm set     Time: 1022-1050 PT Time Calculation (min) (ACUTE ONLY): 28 min  Charges:                       G CodesJanyth Contes 10/27/14, 12:09 PM Janyth Contes, SPT. [phone]

## 2014-10-14 NOTE — Progress Notes (Signed)
Subjective: 3 Days Post-Op Procedure(s) (LRB): intermedullary nailing of left subtrocanteric  femur  fracture (Left)    Patient reports pain as mild. SOME BETTER EACH DAY.    Objective:   VITALS:   Filed Vitals:   10/14/14 0749  BP: 116/52  Pulse: 96  Temp: 99.2 F (37.3 C)  Resp: 14    Neurologically intact Sensation intact distally Intact pulses distally Dorsiflexion/Plantar flexion intact No cellulitis present Compartment soft  DRESSINGS DRY  LABS  Recent Labs  10/12/14 0424 10/13/14 0546 10/14/14 0502  HGB 10.8* 10.7* 10.6*  HCT 33.0* 32.3* 31.4*  WBC 11.3* 11.5* 11.5*  PLT 154 152 155     Recent Labs  10/12/14 0424 10/13/14 0546  NA 137 137  K 3.9 3.8  BUN 10 11  CREATININE 0.65 0.67  GLUCOSE 126* 118*    No results for input(s): LABPT, INR in the last 72 hours.   Assessment/Plan: 3 Days Post-Op Procedure(s) (LRB): intermedullary nailing of left subtrocanteric  femur  fracture (Left)   Discharge to SNF  PWB LEFT LEG RTC 1 WEEK AS ASA 325MG  BID X6 WEEKS

## 2014-10-14 NOTE — Discharge Summary (Signed)
Yolanda Taylor, is a 81 y.o. female  DOB 14-Jul-1930  MRN [id number].  Admission date:  10/10/2014  Admitting Physician  Nicholes Mango, MD  Discharge Date:  10/14/2014   Primary MD  Dion Body, MD  Recommendations for primary care physician for things to follow:  Follow-up with Dr. Earnestine Leys in about 2 weeks. Follow-up with Dr. Netty Starring  in one week.   Admission Diagnosis  Hip pain, acute, left [M25.552] Femur fracture, left, closed, initial encounter [S72.92XA]   Discharge Diagnosis  Hip pain, acute, left [M25.552] Femur fracture, left, closed, initial encounter [S72.92XA]    Active Problems:   Femoral condyle fracture      Past Medical History  Diagnosis Date  . Cancer   . Hypertension   . COPD (chronic obstructive pulmonary disease)     Past Surgical History  Procedure Laterality Date  . Breast surgery    . Cholecystectomy    . Abdominal hysterectomy    . Eye surgery    . Hernia repair    . Intramedullary (im) nail intertrochanteric Left 10/11/2014    Procedure: intermedullary nailing of left subtrocanteric  femur  fracture;  Surgeon: Earnestine Leys, MD;  Location: [facility] ORS;  Service: Orthopedics;  Laterality: Left;       History of present illness and  Hospital Course:     Kindly see H&P for history of present illness and admission details, please review complete Labs, Consult reports and Test reports for all details in brief  HPI  from the history and physical done on the day of admission  81 year old female patient with hypertension, COPD, right breast cancer in remission comes in because of fall at home patient .x-ray of the left hip revealed the left femoral fracture. Patient is admitted  for this.  Hospital Course  #1 left femur fracture: In by Dr. Earnestine Leys, status post  ORIF on July 25. Patient tolerated the procedure well seen the physical therapy they recommended rehabilitation , discharge her to [facility], continue Lovenox, pain medications and follow up with Dr. Earnestine Leys in 1 week. #2 postoperative fever: Patient blood cultures negative urine cultures are negative chest x-ray negative for pneumonia. WBC on admission 15.2 came down to 11.5 today. She is getting Cipro empirically. #3 history of COPD patient has no wheezing or rales continue home medications.  #4: Hypertension well controlled. History of breast cancer in remission.  Acute blood loss anemia  Due to surgery;did not require transfusion  Discharge Condition:    Follow UP      Follow-up Information    Follow up with Dion Body, MD On 10/21/2014.   Specialty:  Family Medicine   Why:  at [address]   Contact information:   [place] [address] [phone]       Follow up with Park Breed, MD On 10/28/2014.   Specialty:  Specialist   Why:  at [phone] information:   [place]  [id number] [phone]         Discharge Instructions  and  Discharge Medications        Medication List    TAKE these medications        acetaminophen 500 MG tablet  Commonly known as:  TYLENOL  Take 1 tablet by mouth every 6 (six) hours as needed.     ADVAIR DISKUS 250-50 MCG/DOSE Aepb  Generic drug:  Fluticasone-Salmeterol  Inhale 1 puff into the lungs 2 (two) times daily.  albuterol 108 (90 BASE) MCG/ACT inhaler  Commonly known as:  PROVENTIL HFA;VENTOLIN HFA  Inhale 2 puffs into the lungs every 4 (four) hours as needed for wheezing or shortness of breath.     aspirin 81 MG tablet  Take 81 mg by mouth daily.     CALCIUM 600 600 MG Tabs tablet  Generic drug:  calcium carbonate  Take 600 mg by mouth daily.     ciprofloxacin 500 MG tablet  Commonly known as:  CIPRO  Take 1 tablet (500 mg total) by mouth 2 (two) times daily.      diazepam 5 MG tablet  Commonly known as:  VALIUM  Take 1 tablet (5 mg total) by mouth every 8 (eight) hours as needed for anxiety or muscle spasms.     enoxaparin 40 MG/0.4ML injection  Commonly known as:  LOVENOX  Inject 0.4 mLs (40 mg total) into the skin daily.     ferrous sulfate 325 (65 FE) MG tablet  Take 1 tablet (325 mg total) by mouth 2 (two) times daily with a meal.     fluticasone 50 MCG/ACT nasal spray  Commonly known as:  FLONASE  Place 2 sprays into both nostrils daily.     guaiFENesin 600 MG 12 hr tablet  Commonly known as:  MUCINEX  Take 600 mg by mouth 2 (two) times daily.     hydrochlorothiazide 25 MG tablet  Commonly known as:  HYDRODIURIL  Take 1 tablet by mouth daily.     HYDROcodone-acetaminophen 5-325 MG per tablet  Commonly known as:  NORCO/VICODIN  Take 1-2 tablets by mouth every 6 (six) hours as needed for moderate pain.     metoCLOPramide 5 MG tablet  Commonly known as:  REGLAN  Take 5 mg by mouth 2 (two) times daily.     omeprazole 20 MG capsule  Commonly known as:  PRILOSEC  Take 20 mg by mouth daily.     pantoprazole 40 MG tablet  Commonly known as:  PROTONIX  Take 1 tablet by mouth daily.     PROBIOTIC COLON SUPPORT PO  Take 1 capsule by mouth daily.     Vitamin D (Ergocalciferol) 50000 UNITS Caps capsule  Commonly known as:  DRISDOL  Take 1 capsule by mouth daily.          Diet and Activity recommendation: See Discharge Instructions above   Consults obtained - ortho   Major procedures and Radiology Reports - PLEASE review detailed and final reports for all details, in brief -     Dg Chest 1 View  10/13/2014   CLINICAL DATA:  Fever.  Recent admission for hip fracture.  EXAM: CHEST  1 VIEW  COMPARISON:  Chest radiograph 10/10/2014.  FINDINGS: Low lung volumes with bibasilar atelectasis greater on the LEFT. Small LEFT effusion. Retrocardiac density is increased, and early LEFT lower lobe infiltrate cannot be excluded.  Osteopenia. Degenerative change both shoulders. Normal heart size. Significant worsening aeration from preoperative radiograph.  IMPRESSION: Worsening aeration. Retrocardiac density representing atelectasis with or without superimposed infiltrate. Small effusion.   Electronically Signed   By: Staci Righter M.D.   On: 10/13/2014 09:23   Dg Chest 1 View  10/10/2014   CLINICAL DATA:  Preoperative  EXAM: CHEST  1 VIEW  COMPARISON:  05/12/2012  FINDINGS: There is hyperinflation and chronic appearing interstitial coarsening. There is no confluent airspace consolidation. There is no large effusion. There is no pneumothorax.  IMPRESSION: Hyperinflation.  Chronic appearing interstitial coarsening.  Electronically Signed   By: Andreas Newport M.D.   On: 10/10/2014 22:22   Dg C-arm 61-120 Min-no Report  10/11/2014   CLINICAL DATA: Fracture   C-ARM 61-120 MINUTES  Fluoroscopy was utilized by the requesting physician.  No radiographic  interpretation.    Dg Hip Unilat With Pelvis 2-3 Views Left  10/10/2014   CLINICAL DATA:  Fall now with left hip pain and tenderness about the proximal femur. Tripped over rug earlier this afternoon.  EXAM: DG HIP (WITH OR WITHOUT PELVIS) 2-3V LEFT  COMPARISON:  Abdominal radiograph 08/13/2012  FINDINGS: Displaced angulated fracture of the proximal femoral diaphysis. There is greater than 1 shaft width medial displacement of main femoral shaft and at least 3 cm osseous overriding. No definite intertrochanteric extension. The femoral head remains seated in the acetabulum. Pubic rami appear intact. Pelvic calcifications likely combination of phlebolith, vascular, and questionable fibroid, unchanged.  IMPRESSION: Displaced angulated proximal femoral diaphyseal fracture with osseous overriding. No definite extension into the intertrochanteric femur.   Electronically Signed   By: Jeb Levering M.D.   On: 10/10/2014 22:21   Dg Femur Port Min 2 Views Left  10/11/2014   CLINICAL DATA:   Postop  EXAM: LEFT FEMUR PORTABLE 2 VIEWS  COMPARISON:  10/10/2014  FINDINGS: Intra medullary rod and femoral neck screw now transfix a proximal femur diaphysis fracture. There is 1 distal interlocking screw. Anatomic alignment. No breakage or loosening of the hardware.  IMPRESSION: ORIF left femur fracture.   Electronically Signed   By: Marybelle Killings M.D.   On: 10/11/2014 21:24    Micro Results     Recent Results (from the past 240 hour(s))  Surgical pcr screen     Status: None   Collection Time: 10/11/14  8:50 AM  Result Value Ref Range Status   MRSA, PCR NEGATIVE NEGATIVE Final   Staphylococcus aureus NEGATIVE NEGATIVE Final    Comment:        The Xpert SA Assay (FDA approved for NASAL specimens in patients over 49 years of age), is one component of a comprehensive surveillance program.  Test performance has been validated by [facility] for patients greater than or equal to 30 year old. It is not intended to diagnose infection nor to guide or monitor treatment.   Urine culture     Status: None (Preliminary result)   Collection Time: 10/13/14  4:15 PM  Result Value Ref Range Status   Specimen Description URINE, CLEAN CATCH  Final   Special Requests Normal  Final   Culture NO GROWTH < 24 HOURS  Final   Report Status PENDING  Incomplete       Today   Subjective:   Fidela Cieslak today has no headache,no chest abdominal pain,no new weakness tingling or numbness, feels much better wants to go home today.   Objective:   Blood pressure 116/52, pulse 96, temperature 99.2 F (37.3 C), temperature source Oral, resp. rate 14, height 5\' 5"  (1.651 m), weight 71.442 kg (157 lb 8 oz), SpO2 94 %.   Intake/Output Summary (Last 24 hours) at 10/14/14 1154 Last data filed at 10/14/14 0900  Gross per 24 hour  Intake    480 ml  Output    600 ml  Net   -120 ml    Exam Awake Alert, Oriented x 3, No new F.N deficits, Normal affect [place].AT,PERRAL Supple Neck,No JVD, No cervical  lymphadenopathy appriciated.  Symmetrical Chest wall movement, Good air movement bilaterally, CTAB RRR,No Gallops,Rubs or new  Murmurs, No Parasternal Heave +ve B.Sounds, Abd Soft, Non tender, No organomegaly appriciated, No rebound -guarding or rigidity. No Cyanosis, Clubbing or edema, No new Rash or bruise  Data Review   CBC w Diff:  Lab Results  Component Value Date   WBC 11.5* 10/14/2014   WBC 8.9 09/12/2011   HGB 10.6* 10/14/2014   HGB 14.1 09/12/2011   HCT 31.4* 10/14/2014   HCT 42.0 09/12/2011   PLT 155 10/14/2014   PLT 200 05/13/2012   LYMPHOPCT 6 10/10/2014   MONOPCT 5 10/10/2014   EOSPCT 0 10/10/2014   BASOPCT 0 10/10/2014    CMP:  Lab Results  Component Value Date   NA 137 10/13/2014   NA 141 09/12/2011   K 3.8 10/13/2014   K 3.1* 09/12/2011   CL 103 10/13/2014   CL 102 09/12/2011   CO2 26 10/13/2014   CO2 30 09/12/2011   BUN 11 10/13/2014   BUN 19* 09/12/2011   CREATININE 0.67 10/13/2014   CREATININE 0.99 09/12/2011   PROT 6.5 09/12/2011   ALBUMIN 4.0 10/10/2014   ALBUMIN 3.6 09/12/2011   BILITOT 0.4 09/12/2011   ALKPHOS 66 09/12/2011   AST 20 09/12/2011   ALT 23 09/12/2011  .   Total Time in preparing paper work, data evaluation and todays exam - 55 minutes  Diogenes Whirley M.D on 10/14/2014 at 11:54 AM

## 2014-10-14 NOTE — Progress Notes (Addendum)
Patient is medically stable for D/C today to [facility]. Per Kim admissions coordinator at [facility] patient is going to room 209-B. RN will call report at [phone] and arrange EMS for transport. Clinical Education officer, museum (CSW) prepared D/C packet and sent D/C Summary to [employer] via [facility]finder. Patient is aware of above. CSW attempted to contact patient's daughter [name] however her voicemail was full. Please reconsult if future social work needs arise. CSW signing off.  CSW sent updated D/C Summary to [facility] via carefinder.    Blima Rich, [place] [phone]

## 2014-10-15 DIAGNOSIS — R350 Frequency of micturition: Secondary | ICD-10-CM | POA: Diagnosis not present

## 2014-10-15 DIAGNOSIS — R3915 Urgency of urination: Secondary | ICD-10-CM | POA: Diagnosis not present

## 2014-10-15 LAB — URINALYSIS COMPLETE WITH MICROSCOPIC (ARMC ONLY)
BILIRUBIN URINE: NEGATIVE
GLUCOSE, UA: NEGATIVE mg/dL
HGB URINE DIPSTICK: NEGATIVE
KETONES UR: NEGATIVE mg/dL
Leukocytes, UA: NEGATIVE
NITRITE: NEGATIVE
PH: 6 (ref 5.0–8.0)
Protein, ur: NEGATIVE mg/dL
Specific Gravity, Urine: 1.014 (ref 1.005–1.030)

## 2014-10-15 LAB — URINE CULTURE: Special Requests: NORMAL

## 2014-10-17 LAB — URINE CULTURE: Culture: NO GROWTH

## 2014-10-18 ENCOUNTER — Encounter
Admission: RE | Admit: 2014-10-18 | Discharge: 2014-10-18 | Disposition: A | Discharge status: Home / Self Care | Payer: Medicare Other | Source: Ambulatory Visit | Attending: Internal Medicine | Admitting: Internal Medicine

## 2014-10-18 DIAGNOSIS — R300 Dysuria: Secondary | ICD-10-CM | POA: Insufficient documentation

## 2014-10-26 DIAGNOSIS — R300 Dysuria: Secondary | ICD-10-CM | POA: Diagnosis present

## 2014-10-26 LAB — URINALYSIS COMPLETE WITH MICROSCOPIC (ARMC ONLY)
Bilirubin Urine: NEGATIVE
Glucose, UA: NEGATIVE mg/dL
Ketones, ur: NEGATIVE mg/dL
Leukocytes, UA: NEGATIVE
NITRITE: NEGATIVE
PH: 7 (ref 5.0–8.0)
Protein, ur: NEGATIVE mg/dL
Specific Gravity, Urine: 1.006 (ref 1.005–1.030)

## 2014-10-31 LAB — URINE CULTURE

## 2015-02-23 ENCOUNTER — Other Ambulatory Visit: Payer: Self-pay | Admitting: Specialist

## 2015-02-23 DIAGNOSIS — S72332D Displaced oblique fracture of shaft of left femur, subsequent encounter for closed fracture with routine healing: Secondary | ICD-10-CM

## 2015-02-28 ENCOUNTER — Other Ambulatory Visit: Payer: Self-pay | Admitting: Family Medicine

## 2015-02-28 DIAGNOSIS — R42 Dizziness and giddiness: Secondary | ICD-10-CM

## 2015-02-28 DIAGNOSIS — R112 Nausea with vomiting, unspecified: Secondary | ICD-10-CM

## 2015-03-03 ENCOUNTER — Ambulatory Visit
Admission: RE | Admit: 2015-03-03 | Discharge: 2015-03-03 | Disposition: A | Discharge status: Home / Self Care | Payer: Medicare Other | Source: Ambulatory Visit | Attending: Family Medicine | Admitting: Family Medicine

## 2015-03-03 DIAGNOSIS — R112 Nausea with vomiting, unspecified: Secondary | ICD-10-CM | POA: Diagnosis present

## 2015-03-03 DIAGNOSIS — R42 Dizziness and giddiness: Secondary | ICD-10-CM | POA: Diagnosis not present

## 2015-03-03 MED ORDER — GADOBENATE DIMEGLUMINE 529 MG/ML IV SOLN
15.0000 mL | Freq: Once | INTRAVENOUS | Status: AC | PRN
  Administered 2015-03-03: 14 mL via INTRAVENOUS

## 2015-03-07 ENCOUNTER — Ambulatory Visit: Payer: Medicare Other

## 2015-03-08 ENCOUNTER — Encounter
Admission: RE | Admit: 2015-03-08 | Discharge: 2015-03-08 | Disposition: A | Discharge status: Home / Self Care | Payer: Medicare Other | Source: Ambulatory Visit | Attending: Specialist | Admitting: Specialist

## 2015-03-08 DIAGNOSIS — S72332D Displaced oblique fracture of shaft of left femur, subsequent encounter for closed fracture with routine healing: Secondary | ICD-10-CM | POA: Insufficient documentation

## 2015-03-08 MED ORDER — TECHNETIUM TC 99M MEDRONATE IV KIT
25.0000 | PACK | Freq: Once | INTRAVENOUS | Status: AC | PRN
  Administered 2015-03-08: 21.44 via INTRAVENOUS

## 2015-03-17 ENCOUNTER — Other Ambulatory Visit: Payer: Self-pay | Admitting: Family Medicine

## 2015-03-17 DIAGNOSIS — Z1231 Encounter for screening mammogram for malignant neoplasm of breast: Secondary | ICD-10-CM

## 2015-03-22 ENCOUNTER — Encounter: Payer: Self-pay | Admitting: Physical Therapy

## 2015-03-22 ENCOUNTER — Ambulatory Visit: Payer: Medicare Other | Attending: Neurology | Admitting: Physical Therapy

## 2015-03-22 DIAGNOSIS — R29818 Other symptoms and signs involving the nervous system: Secondary | ICD-10-CM | POA: Diagnosis present

## 2015-03-22 DIAGNOSIS — R2689 Other abnormalities of gait and mobility: Secondary | ICD-10-CM

## 2015-03-22 DIAGNOSIS — R42 Dizziness and giddiness: Secondary | ICD-10-CM | POA: Insufficient documentation

## 2015-03-22 NOTE — Therapy (Signed)
[place] MAIN [facility] SERVICES [address] [place], [place], [address] Phone: [phone]   Fax:  [phone]  Physical Therapy Evaluation  Patient Details  Name: Yolanda Taylor MRN: [id number] Date of Birth: March 28, 1930 No Data Recorded  Encounter Date: 03/22/2015      PT End of Session - 03/22/15 1348    Visit Number 1   Number of Visits 9   Date for PT Re-Evaluation 05/17/15   Authorization Type Needs G codes every 10th visit   PT Start Time 1355   PT Stop Time 1510   PT Time Calculation (min) 75 min   Equipment Utilized During Treatment Gait belt   Activity Tolerance Patient tolerated treatment well   Behavior During Therapy [facility] for tasks assessed/performed      Past Medical History  Diagnosis Date  . Cancer ([place])   . Hypertension   . COPD (chronic obstructive pulmonary disease) [facility])     Past Surgical History  Procedure Laterality Date  . Breast surgery    . Cholecystectomy    . Abdominal hysterectomy    . Eye surgery    . Hernia repair    . Intramedullary (im) nail intertrochanteric Left 10/11/2014    Procedure: intermedullary nailing of left subtrocanteric  femur  fracture;  Surgeon: Earnestine Leys, MD;  Location: [facility] ORS;  Service: Orthopedics;  Laterality: Left;       Subjective Assessment - 03/26/15 1431    Subjective Pt reports that the dizziness started about 2 months ago. Pt reports that she has been seen by her internal medicine MD, Dr. Netty Starring, by ENT Md Dr. Richardson Landry and by a neurologist, Dr. Melrose Nakayama in regards to her dizziness issues. Pt reports that Dr. Richardson Landry did test for "crystals" in the ear. Pt does not believe that a full VNG test was performed. Pt did underwent a brain MRI a few weeks ago and pt reports that "Dr. Richardson Landry said MRI did not show anything." Pt reports she has had brain MRIs done in 2011 and 2016 and states Dr. Melrose Nakayama said there was a little change in the blood flow to the brain. Pt  denies having had an MRA of the brain. Dr. Netty Starring gave her CRT exercises to do at home.  Pt states her R ear feels stopped up 75% of the time. Pt states the dizziness starts when her "feet hit the floor" and when she stands up it starts again.  Pt reports 5 years she had injury to the head with no LOC that was witnessed. Pt reports that she has had prior episodes of dizziness 3 years ago when she had to climb the stadium steps to attend her grandchildren's sporting events. She would get dizziness if she climbed to the top and tried to climb down again and climbing steps in general would cause her to have dizziness. Pt reports nausea this date. Pt states she cannot drive at present. Pt reports oscillopsia.    Patient is accompained by: Family member  sister   Currently in Pain? No/denies      There were no vitals filed for this visit.  Visit Diagnosis:  Dizziness and giddiness  Balance problem  Pt reports prior neck sx at age 50-discectomy  VESTIBULAR AND BALANCE EVALUATION  Onset Date: Pt reports it has been close to two months since the dizziness began.   HISTORY:  Subjective history of current problem: Pt reports that the dizziness started about 2 months ago. Pt reports that she has  been seen by her internal medicine MD, Dr. Netty Starring, by ENT Md Dr. Richardson Landry and by a neurologist, Dr. Melrose Nakayama in regards to her dizziness issues. Pt reports that Dr. Richardson Landry did test for "crystals" in the ear. Pt does not believe that a full VNG test was performed. Pt did underwent a brain MRI a few weeks ago and pt reports that "Dr. Richardson Landry said MRI did not show anything." Pt reports she has had brain MRIs done in 2011 and 2016 and states Dr. Melrose Nakayama said there was a little change in the blood flow to the brain. Pt denies having had an MRA of the brain. Dr. Netty Starring gave her CRT exercises to do at home.  Pt states her R ear feels stopped up 75% of the time. Pt states the dizziness starts when her "feet hit the  floor" and when she stands up it starts again.  Pt reports 5 years she had injury to the head with no LOC that was witnessed. Pt reports that she has had prior episodes of dizziness 3 years ago when she had to climb the stadium steps to attend her grandchildren's sporting events. She would get dizziness if she climbed to the top and tried to climb down again and climbing steps in general would cause her to have dizziness. Pt reports nausea this date. Pt states she cannot drive at present. Pt reports oscillopsia.  Pt reports chronic right ear pain and wears a hearing aide in the right ear only. Pt denies ear drainage and no tinnitus. Pt reports she gets some blurry vision and some visual changes when she stands up. Pt reports she sees Dr. Ubaldo Glassing, cardiologist, and had an EKG last year she believes. She has a follow-up appt in a couple of months with Dr. Ubaldo Glassing.  Description of dizziness: (vertigo, unsteadiness, lightheadedness, falling, general unsteadiness, aural fullness) dizziness, swimmy-headed, not vertigo, gets sick on stomach when looks at something for a while, light-headedness and felt like she is going to faint.  Frequency: daily Duration: "24/7" but states she is able to sleep without dizziness. Pt states dizziness eases with sitting but it does not ever go fully away.  Symptom nature: (motion provoked/positional/spontaneous/constant, variable, intermittent) constant and motion provoked   Provocative Factors: standing up, moving Easing Factors: staying still  Progression of symptoms: worse History of similar episodes: reports   Falls (yes/no): yes Number of falls in past 6 months: fall in July and broke hip. One fall only  Prior Functional Level: live alone was independent with driving, independent with ADLs. Ambulated without SPC but has been using since the dizziness increased in the last 2 months.     EXAMINATION     SOMATOSENSORY:         Sensation           Intact       Diminished         Absent  Light touch normal    Any N & T in extremities or weakness: deneis except reports occassional intermittent N & T in fingers.       COORDINATION: Finger to Nose:  Normal Past Pointing:   Normal  MUSCULOSKELETAL SCREEN: Cervical Spine ROM: WFL AROM cervical L/R rotation and flexion/extension without pain   ROM: deferred  MMT:  Left hip grossly +4/5, quads 5/5, hams -5/5 and DF 5/5 Right hip grossly -5/5, quads 5/5, hams and DF 5/5  Gait: pt arrives ambulating with SPC with mild unsteadiness noted especially with turning corners Scanning of visual  environment with gait is: fair  Balance: Demonstrates difficulty with uneven surfaces, EC, ambulation with head turns, narrow BOS activities.    POSTURAL CONTROL TESTS:   Clinical Test of Sensory Interaction for Balance    (CTSIB):  CONDITION TIME STRATEGY SWAY  Eyes open, firm surface 30 sec ankle +1  Eyes closed, firm surface 30 sec ankle +2  Eyes open, foam surface 30 sec ankle +2  Eyes closed, foam surface 4 seconds Ankle and hip and step +3    OCULOMOTOR / VESTIBULAR TESTING:  Oculomotor Exam- Room Light  Normal Abnormal Comments  Ocular Alignment N    Ocular ROM N    Spontaneous Nystagmus N    End-Gaze Nystagmus  Abn   Smooth Pursuit  Abn Causes dizziness  Saccades  Abn Hypometric saccades; dizziness  VOR  Abn Dizziness and nystagmus noted   VOR Cancellation  Abn dizziness  Left Head Thrust   Deferred secondary to patient too symptomatic to test this date and unable to tolerate  Right Head Thrust     Head Shaking Nystagmus   Deferred secondary to unable to tolerate this date     BPPV TESTS:  Symptoms Duration Intensity Nystagmus  L Dix-Hallpike No increase from her baseline level of dizziness and no vertigo reported N/A  none  R Dix-Hallpike No increase from her baseline level of dizziness and no vertigo reported N/A  none    FUNCTIONAL OUTCOME MEASURES:  Results Comments  DHI 56  Moderate perception of handicap; in need of intervention  ABC Scale TBA next visit as pt did not complete    DGI 19/24 without cane Mild impairment; DGI without cane 2 for horiz and vert and 1 for steps, rest 3  10 meter Walking Speed .76 m/sec without cane Decreased speed as compared to age and gender normative values; in need of intervention    Blood pressure in supine   141/62 mmHg Sitting  131/59 mmHg Standing  123/66 mmHg  Neuromuscular Re-education: Demonstrated and described. VOR X 1 exercise. Pt performed 1 rep of 30 seconds and 2 reps of 1 minute of VOR X 1 horiz in sitting with vc for technique. Pt describes worsening of her dizziness symptoms to 7/10 with VOR X 1 horiz exercise. Issued for HEP.       [facility] PT Assessment - 03/22/15 0001    Balance Screen   Has the patient fallen in the past 6 months Yes   How many times? 1   Has the patient had a decrease in activity level because of a fear of falling?  Yes   Is the patient reluctant to leave their home because of a fear of falling?  No   Standardized Balance Assessment   Standardized Balance Assessment Dynamic Gait Index;Vestibular Evaluation;10 meter walk test   10 Meter Walk 13 seconds   Dynamic Gait Index   Level Surface Normal   Change in Gait Speed Normal   Gait with Horizontal Head Turns Mild Impairment   Gait with Vertical Head Turns Mild Impairment   Gait and Pivot Turn Normal   Step Over Obstacle Mild Impairment   Step Around Obstacles Normal   Steps Moderate Impairment   Total Score 19   DGI comment: performed without use of [facility]                           PT Education - 03/22/15 1348    Education provided Yes   Education  Details discussed vertigo, BPPV, POC, VOR X exercise   Person(s) Educated Patient;Other (comment)  pt's sister   Methods Explanation;Demonstration;Handout;Verbal cues   Comprehension Verbalized understanding;Verbal cues required;Returned demonstration              PT Long Term Goals - Apr 18, 2015 1349    PT LONG TERM GOAL #1   Title Patient will demonstrate reduced falls risk as evidenced by Dynamic Gait Index (DGI) 21/24 or greater.   Baseline scored 19/24 on the DGI on 04-18-15   Time 8   Period Weeks   Status New   PT LONG TERM GOAL #2   Title Patient will reduce perceived disability to low levels as indicated by <40 on Dizziness Handicap Inventory.   Baseline on 04/18/15   Time 8   Period Weeks   Status New   PT LONG TERM GOAL #3   Title Patient will improve community mobility and reduce falls risk as evidenced by self-selected 47m walk speed >1.0 m/s.   Baseline on April 18, 2015   Time 8   Period --   Status New   PT LONG TERM GOAL #4   Title Patient will report a 40% or more reduction in dizziness symptoms with provoking motions or positions.   Time 8   Period Weeks   Status On-going   PT LONG TERM GOAL #5   Title Patient will be able to perform home program independently for self-management.   Time 8   Period Weeks   Status New                Plan - 03/26/15 1444    Clinical Impression Statement Pt presents with listed impairements, complaints of persistent dizziness and demonstrates vestibulo/ocular deficits with vestibular testing. Pt is at risk for falls. Pt demonstrates potential indicators of central signs with vestibular testing including abnormal smooth persuits and saccades. Pt would benefit from PT services to address goals, subjective symptoms, improve balance and try to reduce falls risk.    Pt will benefit from skilled therapeutic intervention in order to improve on the following deficits Difficulty walking;Decreased activity tolerance;Dizziness;Decreased balance;Other (comment)  vestibulo-ocular deficits   Rehab Potential Fair   Clinical Impairments Affecting Rehab Potential Positive indicators: family support   Negative indicators: age, chronicity, potential indicators of central signs with vestibular testing   PT  Frequency 1x / week   PT Duration 8 weeks   PT Treatment/Interventions Vestibular;Gait training;Canalith Repostioning;Balance training;Neuromuscular re-education;Patient/family education;Stair training   PT Next Visit Plan Review VOR X 1 exercise; Add additional exercises to HEP; consider working on Airex pad activities with head turns, EO/EC on Airex pad.    PT Home Exercise Plan VOR   Consulted and Agree with Plan of Care Patient;Family member/caregiver   Family Member Consulted sister          G-Codes - 04-18-2015 1349    Functional Assessment Tool Used DGI, DHI, 10 meter walking speed, clinical judgment   Functional Limitation Mobility: Walking and moving around   Mobility: Walking and Moving Around Current Status [phone]) At least 20 percent but less than 40 percent impaired, limited or restricted   Mobility: Walking and Moving Around Goal Status [phone]) At least 1 percent but less than 20 percent impaired, limited or restricted       Problem List Patient Active Problem List   Diagnosis Date Noted  . Femoral condyle fracture ([facility]) 10/11/2014   Lady Deutscher PT, DPT Murphy,Dorriea 04/18/2015, 3:55 PM  [place]  MAIN [facility] SERVICES [place], [place], [address] Phone: [phone]   Fax:  [phone]  Name: Yolanda Taylor MRN: [id number] Date of Birth: [birth year]

## 2015-03-28 ENCOUNTER — Ambulatory Visit: Payer: Medicare Other | Admitting: Physical Therapy

## 2015-03-31 ENCOUNTER — Encounter: Payer: Self-pay | Admitting: Physical Therapy

## 2015-03-31 ENCOUNTER — Ambulatory Visit: Payer: Medicare Other | Admitting: Physical Therapy

## 2015-03-31 DIAGNOSIS — R42 Dizziness and giddiness: Secondary | ICD-10-CM

## 2015-03-31 DIAGNOSIS — R2689 Other abnormalities of gait and mobility: Secondary | ICD-10-CM

## 2015-03-31 NOTE — Therapy (Signed)
[name] [facility] SERVICES [address] [place], [place], [address] Phone: [phone]   Fax:  [phone]  Physical Therapy Treatment  Patient Details  Name: Yolanda Taylor MRN: [id number] Date of Birth: 04-03-1930 No Data Recorded  Encounter Date: 03/31/2015      PT End of Session - 03/31/15 1647    Visit Number 2   Number of Visits 9   Date for PT Re-Evaluation 05/17/15   Authorization Type Needs G codes every 10th visit   PT Start Time 1505   PT Stop Time 1604   PT Time Calculation (min) 59 min   Equipment Utilized During Treatment Gait belt   Activity Tolerance Patient tolerated treatment well   Behavior During Therapy [facility] for tasks assessed/performed      Past Medical History  Diagnosis Date  . Cancer ([place])   . Hypertension   . COPD (chronic obstructive pulmonary disease) [facility])     Past Surgical History  Procedure Laterality Date  . Breast surgery    . Cholecystectomy    . Abdominal hysterectomy    . Eye surgery    . Hernia repair    . Intramedullary (im) nail intertrochanteric Left 10/11/2014    Procedure: intermedullary nailing of left subtrocanteric  femur  fracture;  Surgeon: Earnestine Leys, MD;  Location: [facility] ORS;  Service: Orthopedics;  Laterality: Left;    There were no vitals filed for this visit.  Visit Diagnosis:  Dizziness and giddiness  Balance problem      Subjective Assessment - 03/31/15 1645    Subjective Pt reports that she is concerned becuase she states she has been doing her exercises faithfully but she does not feel that she has improved yet. Pt reports that she continues to have persistent dizziness.    Patient is accompained by: Family member       Neuromuscular Re-education:  VOR exercise: In standing on Airex pad, Pt performed VOR X 1 horiz 3 reps of 1 minute each one in sitting, one Airex pad and one on firm surface. Pt required cue to turn her head as quickly but keeping  the target in focus. Pt was performing exercise correctly except her head movement rate is slower. Pt reports increase in dizziness with this exercise, rating 7-8/10.    Active eye movement between 2 Targets: Pt performed active eye movements between two targets horiz 2 reps of 60 seconds each with verbal and tactile cuing. Pt reports increase in dizziness with this exercise, rating 7-8/10.    Airex pad: On firm surface and then on Airex pad, performed feet together (feet about 1/2 to 1" apart) with horiz and semi-tandem progressions with alternate lead leg with horiz and vertical head turns and then with body turns with CGA.    Walking with head turns: Performed 66' trials times two of retro ambulation with horiz head turns with CGA.   Diona Foley toss to self: Performed static ball toss to self horiz and vert while turning head and eyes to follow ball. Pt reports vert head turns are easier than the horiz. Then, worked on 37' trials of forward ambulation while doing ball toss to self vert and horiz multiple reps of each with CGA.        PT Education - 03/31/15 1646    Education provided Yes   Person(s) Educated Patient  sister   Methods Explanation;Demonstration;Handout;Verbal cues   Comprehension Verbalized understanding;Returned demonstration  PT Long Term Goals - 03/22/15 1349    PT LONG TERM GOAL #1   Title Patient will demonstrate reduced falls risk as evidenced by Dynamic Gait Index (DGI) 21/24 or greater.   Baseline scored 19/24 on the DGI on 03/22/2015   Time 8   Period Weeks   Status New   PT LONG TERM GOAL #2   Title Patient will reduce perceived disability to low levels as indicated by <40 on Dizziness Handicap Inventory.   Baseline on 03/22/2015   Time 8   Period Weeks   Status New   PT LONG TERM GOAL #3   Title Patient will improve community mobility and reduce falls risk as evidenced by self-selected 64m walk speed >1.0 m/s.   Baseline on 03/22/2015   Time  8   Period --   Status New   PT LONG TERM GOAL #4   Title Patient will report a 40% or more reduction in dizziness symptoms with provoking motions or positions.   Time 8   Period Weeks   Status On-going   PT LONG TERM GOAL #5   Title Patient will be able to perform home program independently for self-management.   Time 8   Period Weeks   Status New               Plan - 03/31/15 1647    Clinical Impression Statement Pt demonstrates difficulty with narrow BOS and uneven surface activities this date. Pt was able to work on progressions of vestibular exercises and added these as well as additional exercises to HEP. Pt encouraged to follow-up as scheduled.    Pt will benefit from skilled therapeutic intervention in order to improve on the following deficits Difficulty walking;Decreased activity tolerance;Dizziness;Decreased balance;Other (comment)  vestibulo-ocular deficits   Rehab Potential Fair   Clinical Impairments Affecting Rehab Potential Positive indicators: family support   Negative indicators: age, chronicity, potential indicators of central signs with vestibular testing   PT Frequency 1x / week   PT Duration 8 weeks   PT Treatment/Interventions Vestibular;Gait training;Canalith Repostioning;Balance training;Neuromuscular re-education;Patient/family education;Stair training   PT Next Visit Plan Review VOR X 1 exercise; Add additional exercises to HEP; consider working on Airex pad activities with head turns, EO/EC on Airex pad.    PT Home Exercise Plan VOR, active eye movement between two targets, feet together and semi-tandem progressions on firm and pillow with and without horiz and vert head turns and body turns, and forward ambulation with head turns horiz.   Consulted and Agree with Plan of Care Patient;Family member/caregiver   Family Member Consulted sister        Problem List Patient Active Problem List   Diagnosis Date Noted  . Femoral condyle fracture (HCC)  10/11/2014   Lady Deutscher PT, DPT Lady Deutscher 03/31/2015, 4:55 PM  [place] MAIN [facility] SERVICES [address] [place], [place], [address] Phone: [phone]   Fax:  [phone]  Name: Yolanda Taylor MRN: [id number] Date of Birth: [birth year]

## 2015-04-04 ENCOUNTER — Ambulatory Visit: Payer: Medicare Other | Admitting: Physical Therapy

## 2015-04-05 ENCOUNTER — Encounter: Payer: Self-pay | Admitting: Physical Therapy

## 2015-04-05 ENCOUNTER — Ambulatory Visit: Payer: Medicare Other | Admitting: Physical Therapy

## 2015-04-05 VITALS — BP 135/64

## 2015-04-05 DIAGNOSIS — R2689 Other abnormalities of gait and mobility: Secondary | ICD-10-CM

## 2015-04-05 DIAGNOSIS — R42 Dizziness and giddiness: Secondary | ICD-10-CM | POA: Diagnosis not present

## 2015-04-05 NOTE — Therapy (Signed)
[place] MAIN [facility] SERVICES [address] [place], [place], [address] Phone: [phone]   Fax:  [phone]  Physical Therapy Treatment  Patient Details  Name: Yolanda Taylor MRN: [id number] Date of Birth: 06-19-1930 No Data Recorded  Encounter Date: 04/05/2015      PT End of Session - 04/05/15 1515    Visit Number 3   Number of Visits 9   Date for PT Re-Evaluation 05/17/15   Authorization Type Needs G codes every 10th visit   PT Start Time 1344   PT Stop Time 1431   PT Time Calculation (min) 47 min   Equipment Utilized During Treatment Gait belt   Activity Tolerance Other (comment)  Patient near tears at times during session and at one point pt reported feeling light-headed and anxious requiring seated rest break   Behavior During Therapy [facility] for tasks assessed/performed      Past Medical History  Diagnosis Date  . Cancer ([place])   . Hypertension   . COPD (chronic obstructive pulmonary disease) [facility])     Past Surgical History  Procedure Laterality Date  . Breast surgery    . Cholecystectomy    . Abdominal hysterectomy    . Eye surgery    . Hernia repair    . Intramedullary (im) nail intertrochanteric Left 10/11/2014    Procedure: intermedullary nailing of left subtrocanteric  femur  fracture;  Surgeon: Earnestine Leys, MD;  Location: [facility] ORS;  Service: Orthopedics;  Laterality: Left;    Filed Vitals:   04/05/15 1512  BP: 135/64    Visit Diagnosis:  Dizziness and giddiness  Balance problem      Subjective Assessment - 04/05/15 1352    Subjective Pt reports that her symptoms "seem to be better" and patient states that she even ventured out to the mall yesterday. Pt states she was able to go shopping without difficulty. Pt states that this morning however she started to feel dizzy around 9 or 10 o'clock in the morning and she took Meclizine which she reports did not help. Pt states she has been doing her exercises  daily. Pt initially denies anxiousness but then during session reports that she does feel anxious when she is dizzy and states she feels this makes her dizziness worse.     Patient is accompained by: Family member  sister   Currently in Pain? No/denies     Neuromuscular Re-education:  Active eye movement between 2 Targets: Pt performed active eye movements between two targets horiz 2 reps of 60 seconds on firm and 1 rep of 1 min on Airex pad in standing. Pt demonstrated good technique.    On Airex balance beam: Pt performed sidestepping L/R without head turns and then with horiz head turns 4 reps times 5' each. Performed on Airex balance beam tandem walking 5' times 2 reps. Pt with several small LOB and reaching to touch momentarily on // bars to regain balance.  Pt became frustrated with her performance of this activity and became tearful and reported that she felt dizzy and that she felt like she "might pass out". Pt had a sitting rest break, drank some water and blood pressure was obtained. Pt encouraged to take slow, deep breaths and discussed and practiced some relaxation techniques. Pt reported improvement in her symptoms and was able to participate in exercises again.    Diona Foley toss over shoulder: Pt ambulated multiple 48' trials of forward and retro ambulation while tossing ball over one shoulder  with return catch over opposite shoulder with CGA. Pt required vc to track the ball with her eyes. Pt performed multiple 77' trials of forward ambulation while tossing ball over one shoulder with return catch over opposite shoulder varying the ball position to head, shoulder and waist level to promote head turning and tilting with vc to track ball with eyes with CGA.   At end of session, pt reported that doing the exercises helped to decrease her symptoms.       PT Education - 04/05/15 1514    Education provided Yes   Education Details Discussed relaxation techniques including breathing and  focused imagery; reviewed active eye movements between two targets. Encouraged patient to continue to do HEP and importance of daily practice. discussed inner ear function as pt had questions in regards to dizziness and hearing loss.    Person(s) Educated Patient;Other (comment)  sister   Methods Explanation;Demonstration   Comprehension Verbalized understanding;Returned demonstration             PT Long Term Goals - 03/22/15 1349    PT LONG TERM GOAL #1   Title Patient will demonstrate reduced falls risk as evidenced by Dynamic Gait Index (DGI) 21/24 or greater.   Baseline scored 19/24 on the DGI on 03/22/2015   Time 8   Period Weeks   Status New   PT LONG TERM GOAL #2   Title Patient will reduce perceived disability to low levels as indicated by <40 on Dizziness Handicap Inventory.   Baseline on 03/22/2015   Time 8   Period Weeks   Status New   PT LONG TERM GOAL #3   Title Patient will improve community mobility and reduce falls risk as evidenced by self-selected 6m walk speed >1.0 m/s.   Baseline on 03/22/2015   Time 8   Period --   Status New   PT LONG TERM GOAL #4   Title Patient will report a 40% or more reduction in dizziness symptoms with provoking motions or positions.   Time 8   Period Weeks   Status On-going   PT LONG TERM GOAL #5   Title Patient will be able to perform home program independently for self-management.   Time 8   Period Weeks   Status New               Plan - 04/05/15 1517    Clinical Impression Statement Pt reporting her dizziness symptoms are better overall, but then will report that she is concerned because she is still is having some days with increased dizziness. Pt reporting compliance with HEP. Pt arrived to clinic with reports of increased dizziness 8-9/10 this date and during session she became tearful and stated she was feeling anxious, dizzy and lightheaded requring sitting rest break. Pt's blood pressure obtained, had a glass of  water and worked on breathing for relaxation and discussed focused imagery technique for relaxation. Pt was then able to resume vestibular exercises and by the end of the session was reporting improvements in her dizziness symptoms.  Encouraged patient to continue to follow-up as scheduled.    Pt will benefit from skilled therapeutic intervention in order to improve on the following deficits Difficulty walking;Decreased activity tolerance;Dizziness;Decreased balance;Other (comment)  vestibulo-ocular deficits   Rehab Potential Fair   Clinical Impairments Affecting Rehab Potential Positive indicators: family support   Negative indicators: age, chronicity, potential indicators of central signs with vestibular testing   PT Frequency 1x / week   PT Duration 8  weeks   PT Treatment/Interventions Vestibular;Gait training;Canalith Repostioning;Balance training;Neuromuscular re-education;Patient/family education;Stair training   PT Next Visit Plan Review VOR X 1 exercise and progress as able. Add additional exercises to HEP; consider trying hallway ball toss and ambulation while tracking targets.    PT Home Exercise Plan VOR, active eye movement between two targets, feet together and semi-tandem progressions on firm and pillow with and without horiz and vert head turns and body turns, and forward ambulation with head turns horiz.   Consulted and Agree with Plan of Care Patient;Family member/caregiver   Family Member Consulted sister        Problem List Patient Active Problem List   Diagnosis Date Noted  . Femoral condyle fracture (HCC) 10/11/2014   Lady Deutscher PT, DPT Lady Deutscher 04/06/2015, 1:31 PM  [place] MAIN [facility] SERVICES [address] [place], [place], [address] Phone: [phone]   Fax:  [phone]  Name: Yolanda Taylor MRN: [id number] Date of Birth: October 22, 1930

## 2015-04-11 ENCOUNTER — Ambulatory Visit: Payer: Medicare Other | Admitting: Physical Therapy

## 2015-04-18 ENCOUNTER — Ambulatory Visit: Payer: Medicare Other | Admitting: Physical Therapy

## 2015-04-25 ENCOUNTER — Encounter: Payer: Medicare Other | Admitting: Physical Therapy

## 2015-05-02 ENCOUNTER — Ambulatory Visit: Payer: Medicare Other | Admitting: Physical Therapy

## 2015-06-17 ENCOUNTER — Ambulatory Visit
Admission: RE | Admit: 2015-06-17 | Discharge: 2015-06-17 | Disposition: A | Discharge status: Home / Self Care | Payer: Medicare Other | Source: Ambulatory Visit | Attending: Family Medicine | Admitting: Family Medicine

## 2015-06-17 ENCOUNTER — Other Ambulatory Visit: Payer: Self-pay | Admitting: Family Medicine

## 2015-06-17 DIAGNOSIS — Z1231 Encounter for screening mammogram for malignant neoplasm of breast: Secondary | ICD-10-CM | POA: Diagnosis present

## 2015-06-17 HISTORY — DX: Malignant neoplasm of unspecified site of unspecified female breast: C50.919

## 2015-08-04 ENCOUNTER — Other Ambulatory Visit: Payer: Self-pay | Admitting: Gastroenterology

## 2015-08-04 DIAGNOSIS — R1013 Epigastric pain: Secondary | ICD-10-CM

## 2015-08-09 ENCOUNTER — Ambulatory Visit
Admission: RE | Admit: 2015-08-09 | Discharge: 2015-08-09 | Disposition: A | Discharge status: Home / Self Care | Payer: Medicare Other | Source: Ambulatory Visit | Attending: Gastroenterology | Admitting: Gastroenterology

## 2015-08-09 DIAGNOSIS — R1013 Epigastric pain: Secondary | ICD-10-CM

## 2015-08-09 DIAGNOSIS — K449 Diaphragmatic hernia without obstruction or gangrene: Secondary | ICD-10-CM | POA: Diagnosis not present

## 2015-08-09 DIAGNOSIS — K228 Other specified diseases of esophagus: Secondary | ICD-10-CM | POA: Diagnosis not present

## 2015-10-18 ENCOUNTER — Ambulatory Visit
Admission: RE | Admit: 2015-10-18 | Discharge: 2015-10-18 | Disposition: A | Discharge status: Home / Self Care | Payer: Medicare Other | Source: Ambulatory Visit | Attending: Gastroenterology | Admitting: Gastroenterology

## 2015-10-18 ENCOUNTER — Other Ambulatory Visit: Payer: Self-pay | Admitting: Gastroenterology

## 2015-10-18 DIAGNOSIS — R1084 Generalized abdominal pain: Secondary | ICD-10-CM | POA: Insufficient documentation

## 2016-02-29 ENCOUNTER — Other Ambulatory Visit: Payer: Self-pay | Admitting: Family Medicine

## 2016-02-29 DIAGNOSIS — Z1231 Encounter for screening mammogram for malignant neoplasm of breast: Secondary | ICD-10-CM

## 2016-05-06 ENCOUNTER — Encounter: Payer: Self-pay | Admitting: Emergency Medicine

## 2016-05-06 ENCOUNTER — Emergency Department: Payer: Medicare Other

## 2016-05-06 DIAGNOSIS — Z853 Personal history of malignant neoplasm of breast: Secondary | ICD-10-CM | POA: Insufficient documentation

## 2016-05-06 DIAGNOSIS — R0602 Shortness of breath: Secondary | ICD-10-CM | POA: Diagnosis present

## 2016-05-06 DIAGNOSIS — I10 Essential (primary) hypertension: Secondary | ICD-10-CM | POA: Diagnosis not present

## 2016-05-06 DIAGNOSIS — J441 Chronic obstructive pulmonary disease with (acute) exacerbation: Secondary | ICD-10-CM | POA: Insufficient documentation

## 2016-05-06 DIAGNOSIS — Z7982 Long term (current) use of aspirin: Secondary | ICD-10-CM | POA: Diagnosis not present

## 2016-05-06 DIAGNOSIS — Z79899 Other long term (current) drug therapy: Secondary | ICD-10-CM | POA: Insufficient documentation

## 2016-05-06 LAB — CBC
HCT: 41.9 % (ref 35.0–47.0)
Hemoglobin: 13.8 g/dL (ref 12.0–16.0)
MCH: 29.9 pg (ref 26.0–34.0)
MCHC: 33 g/dL (ref 32.0–36.0)
MCV: 90.5 fL (ref 80.0–100.0)
PLATELETS: 261 10*3/uL (ref 150–440)
RBC: 4.63 MIL/uL (ref 3.80–5.20)
RDW: 13 % (ref 11.5–14.5)
WBC: 11.5 10*3/uL — ABNORMAL HIGH (ref 3.6–11.0)

## 2016-05-06 LAB — BASIC METABOLIC PANEL
Anion gap: 10 (ref 5–15)
BUN: 20 mg/dL (ref 6–20)
CALCIUM: 9 mg/dL (ref 8.9–10.3)
CO2: 25 mmol/L (ref 22–32)
CREATININE: 1 mg/dL (ref 0.44–1.00)
Chloride: 103 mmol/L (ref 101–111)
GFR calc Af Amer: 59 mL/min — ABNORMAL LOW (ref >60)
GFR calc non Af Amer: 51 mL/min — ABNORMAL LOW (ref >60)
GLUCOSE: 115 mg/dL — AB (ref 65–99)
Potassium: 3.6 mmol/L (ref 3.5–5.1)
Sodium: 138 mmol/L (ref 135–145)

## 2016-05-06 LAB — TROPONIN I

## 2016-05-06 NOTE — ED Notes (Signed)
Pt still anxious; started crying when she was told she was going back out to the lobby to wait for a treatment room; pt says she's just going to go home; daughter here with pt; will put pt and her daughter in the family wait room until treatment room available in hopes of helping pt to relax some; pt says she thinks this may help

## 2016-05-06 NOTE — ED Notes (Signed)
Had just started to triage pt when she said she needed to void and could not wait; pt very anxious; talking in complete coherent sentences; pt reports dizziness for 1 year "24/7" that she says is due to vertigo; assisted in bathroom by daughter and then brought back to triage room;

## 2016-05-06 NOTE — ED Triage Notes (Signed)
Pt reports shortness of breath that started this afternoon; denies cough/fever/cold symptoms; pt anxious; was taken to a local fire department and was told her radial pulse was irregular; EMT's called and they told her to come to ED for irregular heart beat; did not want to ride in ambulance; pt with no history of irregular heart beat;

## 2016-05-07 ENCOUNTER — Emergency Department
Admission: EM | Admit: 2016-05-07 | Discharge: 2016-05-07 | Disposition: A | Discharge status: Home / Self Care | Payer: Medicare Other | Attending: Emergency Medicine | Admitting: Emergency Medicine

## 2016-05-07 ENCOUNTER — Encounter: Payer: Self-pay | Admitting: Emergency Medicine

## 2016-05-07 DIAGNOSIS — R0602 Shortness of breath: Secondary | ICD-10-CM

## 2016-05-07 DIAGNOSIS — J441 Chronic obstructive pulmonary disease with (acute) exacerbation: Secondary | ICD-10-CM

## 2016-05-07 HISTORY — DX: Transient cerebral ischemic attack, unspecified: G45.9

## 2016-05-07 HISTORY — DX: Emphysema, unspecified: J43.9

## 2016-05-07 MED ORDER — ALBUTEROL SULFATE HFA 108 (90 BASE) MCG/ACT IN AERS: 2.0000 | INHALATION_SPRAY | RESPIRATORY_TRACT | 0 refills | Status: DC | PRN

## 2016-05-07 MED ORDER — PREDNISONE 20 MG PO TABS: ORAL_TABLET | ORAL | 0 refills | Status: DC

## 2016-05-07 MED ORDER — IPRATROPIUM-ALBUTEROL 0.5-2.5 (3) MG/3ML IN SOLN
3.0000 mL | Freq: Once | RESPIRATORY_TRACT | Status: AC
  Administered 2016-05-07: 3 mL via RESPIRATORY_TRACT
  Filled 2016-05-07: qty 3

## 2016-05-07 MED ORDER — FLUTICASONE-SALMETEROL 250-50 MCG/DOSE IN AEPB: 1.0000 | INHALATION_SPRAY | Freq: Two times a day (BID) | RESPIRATORY_TRACT | 0 refills | Status: DC

## 2016-05-07 NOTE — ED Provider Notes (Signed)
[facility] Emergency Department Provider Note   ____________________________________________   First MD Initiated Contact with Patient 05/07/16 0129     (approximate)  I have reviewed the triage vital signs and the nursing notes.   HISTORY  Chief Complaint Shortness of Breath    HPI Meladee Coppins is a 83 y.o. female who presents to the ED from home with a chief complaint of shortness of breath. Patient has a history of COPD, not on chronic oxygen therapy. Admits she has not been using her Advair as instructed secondary to finances. States she and her family were driving around the mall when she became vertiginous, which is common for her as she has had constant vertigo for the past 6 months. Associated with it with shortness of breath. Denies associated chest pain, diaphoresis, vomiting. Denies recent fever, chills, cough, congestion, abdominal pain, dysuria, diarrhea. Denies recent travel, traumaor hormone use. Nothing makes her symptoms better or worse. Without intervention, patient's vertigo has almost resolved and her shortness of breath has improved. Of note, patient's family member is an EMT and took her to the local fire department. She was told that her pulse was irregular and referred to the ED for further evaluation.   Past Medical History:  Diagnosis Date  . Breast cancer ([place]) 2006   right breast, radiation  . Cancer ([place])   . COPD (chronic obstructive pulmonary disease) ([place])   . Emphysema lung ([place])   . Hypertension   . TIA (transient ischemic attack)     Patient Active Problem List   Diagnosis Date Noted  . Femoral condyle fracture ([place]) 10/11/2014    Past Surgical History:  Procedure Laterality Date  . ABDOMINAL HYSTERECTOMY    . BREAST EXCISIONAL BIOPSY Left 2001   neg  . BREAST EXCISIONAL BIOPSY Right 2006   positive, lumpectomy  . BREAST SURGERY    . CHOLECYSTECTOMY    . EYE SURGERY    . HERNIA REPAIR    .  INTRAMEDULLARY (IM) NAIL INTERTROCHANTERIC Left 10/11/2014   Procedure: intermedullary nailing of left subtrocanteric  femur  fracture;  Surgeon: Earnestine Leys, MD;  Location: [facility] ORS;  Service: Orthopedics;  Laterality: Left;    Prior to Admission medications   Medication Sig Start Date End Date Taking? Authorizing Provider  acetaminophen (TYLENOL) 500 MG tablet Take 1 tablet by mouth every 6 (six) hours as needed.    Historical Provider, MD  albuterol (PROVENTIL HFA;VENTOLIN HFA) 108 (90 Base) MCG/ACT inhaler Inhale 2 puffs into the lungs every 4 (four) hours as needed for wheezing or shortness of breath. 05/07/16   Paulette Blanch, MD  aspirin 81 MG tablet Take 81 mg by mouth daily.    Historical Provider, MD  calcium carbonate (CALCIUM 600) 600 MG TABS tablet Take 600 mg by mouth daily.    Historical Provider, MD  ciprofloxacin (CIPRO) 500 MG tablet Take 1 tablet (500 mg total) by mouth 2 (two) times daily. 10/14/14   Epifanio Lesches, MD  diazepam (VALIUM) 5 MG tablet Take 1 tablet (5 mg total) by mouth every 8 (eight) hours as needed for anxiety or muscle spasms. 10/14/14   Epifanio Lesches, MD  enoxaparin (LOVENOX) 40 MG/0.4ML injection Inject 0.4 mLs (40 mg total) into the skin daily. 10/14/14   Epifanio Lesches, MD  ferrous sulfate 325 (65 FE) MG tablet Take 1 tablet (325 mg total) by mouth 2 (two) times daily with a meal. 10/14/14   Epifanio Lesches, MD  fluticasone (FLONASE) 50  MCG/ACT nasal spray Place 2 sprays into both nostrils daily.    Historical Provider, MD  Fluticasone-Salmeterol (ADVAIR DISKUS) 250-50 MCG/DOSE AEPB Inhale 1 puff into the lungs 2 (two) times daily. 05/07/16   Paulette Blanch, MD  guaiFENesin (MUCINEX) 600 MG 12 hr tablet Take 600 mg by mouth 2 (two) times daily.    Historical Provider, MD  hydrochlorothiazide (HYDRODIURIL) 25 MG tablet Take 1 tablet by mouth daily. 09/27/14   Historical Provider, MD  HYDROcodone-acetaminophen (NORCO/VICODIN) 5-325 MG per tablet Take  1-2 tablets by mouth every 6 (six) hours as needed for moderate pain. 10/14/14   Epifanio Lesches, MD  Meclizine HCl (MEDI-MECLIZINE PO) Take by mouth.    Historical Provider, MD  meloxicam (MOBIC) 15 MG tablet Take 15 mg by mouth daily.    Historical Provider, MD  metoCLOPramide (REGLAN) 5 MG tablet Take 5 mg by mouth 2 (two) times daily.    Historical Provider, MD  omeprazole (PRILOSEC) 20 MG capsule Take 20 mg by mouth daily.    Historical Provider, MD  pantoprazole (PROTONIX) 40 MG tablet Take 1 tablet by mouth daily. 09/10/14   Historical Provider, MD  predniSONE (DELTASONE) 20 MG tablet 3 tablets daily x 4 days 05/07/16   Paulette Blanch, MD  Probiotic Product (PROBIOTIC COLON SUPPORT PO) Take 1 capsule by mouth daily.    Historical Provider, MD  Vitamin D, Ergocalciferol, (DRISDOL) 50000 UNITS CAPS capsule Take 1 capsule by mouth daily. 08/17/14   Historical Provider, MD    Allergies Paroxetine hcl  Family History  Problem Relation Age of Onset  . Breast cancer Paternal Aunt     Social History Social History  Substance Use Topics  . Smoking status: Never Smoker  . Smokeless tobacco: Never Used  . Alcohol use No    Review of Systems  Constitutional: No fever/chills. Eyes: No visual changes. ENT: No sore throat. Cardiovascular: Denies chest pain. Respiratory: Positive for shortness of breath. Gastrointestinal: No abdominal pain.  No nausea, no vomiting.  No diarrhea.  No constipation. Genitourinary: Negative for dysuria. Musculoskeletal: Negative for back pain. Skin: Negative for rash. Neurological: Negative for headaches, focal weakness or numbness.  10-point ROS otherwise negative.  ____________________________________________   PHYSICAL EXAM:  VITAL SIGNS: ED Triage Vitals  Enc Vitals Group     BP 05/06/16 2139 (!) 151/83     Pulse Rate 05/06/16 2139 96     Resp 05/06/16 2139 18     Temp 05/06/16 2139 97.8 F (36.6 C)     Temp Source 05/06/16 2139 Oral      SpO2 05/06/16 2139 97 %     Weight 05/06/16 2149 152 lb (68.9 kg)     Height 05/06/16 2149 5\' 1"  (1.549 m)     Head Circumference --      Peak Flow --      Pain Score 05/06/16 2147 0     Pain Loc --      Pain Edu? --      Excl. in [place]? --     Constitutional: Alert and oriented. Well appearing and in no acute distress. Eyes: Conjunctivae are normal. PERRL. EOMI. Head: Atraumatic. Nose: No congestion/rhinnorhea. Mouth/Throat: Mucous membranes are moist.  Oropharynx non-erythematous. Neck: No stridor.  No carotid bruits. Cardiovascular: Normal rate, regular rhythm. Grossly normal heart sounds.  Good peripheral circulation. Respiratory: Normal respiratory effort.  No retractions. Lungs diminished, but otherwise CTAB. Gastrointestinal: Soft and nontender. No distention. No abdominal bruits. No CVA tenderness. Musculoskeletal: No lower  extremity tenderness nor edema.  No joint effusions. Neurologic:  Normal speech and language. No gross focal neurologic deficits are appreciated. No gait instability. Skin:  Skin is warm, dry and intact. No rash noted. Psychiatric: Mood and affect are normal. Speech and behavior are normal.  ____________________________________________   LABS (all labs ordered are listed, but only abnormal results are displayed)  Labs Reviewed  BASIC METABOLIC PANEL - Abnormal; Notable for the following:       Result Value   Glucose, Bld 115 (*)    GFR calc non Af Amer 51 (*)    GFR calc Af Amer 59 (*)    All other components within normal limits  CBC - Abnormal; Notable for the following:    WBC 11.5 (*)    All other components within normal limits  TROPONIN I   ____________________________________________  EKG  ED ECG REPORT I, Kaleyah Labreck J, the attending physician, personally viewed and interpreted this ECG.   Date: 05/07/2016  EKG Time: 2154  Rate: 85  Rhythm: normal EKG, normal sinus rhythm  Axis: LAD  Intervals:right bundle branch block  ST&T  Change: Nonspecific  ____________________________________________  RADIOLOGY  Chest 2 view interpreted per Dr. Tery Sanfilippo: Emphysema with probable basilar scarring although infection in the  lung bases cannot be completely excluded.   ____________________________________________   PROCEDURES  Procedure(s) performed: None  Procedures  Critical Care performed: No  ____________________________________________   INITIAL IMPRESSION / ASSESSMENT AND PLAN / ED COURSE  Pertinent labs & imaging results that were available during my care of the patient were reviewed by me and considered in my medical decision making (see chart for details).  83 year old female with COPD who presents with mild shortness of breath without chest pain. Normal troponin, EKG reveals normal sinus rhythm with occasional PVCs. Primary nurse appreciated light wheezing on auscultation. On my exam, lungs are diminished bilaterally. Given patient's concern for finances related to her hospital bill, will administer DuoNeb now and patient will start prescription for prednisone in the morning. Advair prescription has been renewed, and patient will also be discharged with a prescription for albuterol rescue inhaler. She is well-appearing, in stable condition without high suspicion for ACS, PE, aortic dissection. Strict return precautions given. Patient and her daughter verbalize understanding and agree with plan of care.      ____________________________________________   FINAL CLINICAL IMPRESSION(S) / ED DIAGNOSES  Final diagnoses:  COPD exacerbation (HCC)  Shortness of breath      NEW MEDICATIONS STARTED DURING THIS VISIT:  New Prescriptions   ALBUTEROL (PROVENTIL HFA;VENTOLIN HFA) 108 (90 BASE) MCG/ACT INHALER    Inhale 2 puffs into the lungs every 4 (four) hours as needed for wheezing or shortness of breath.   PREDNISONE (DELTASONE) 20 MG TABLET    3 tablets daily x 4 days     Note:  This document was  prepared using Dragon voice recognition software and may include unintentional dictation errors.    Paulette Blanch, MD 05/07/16 [phone]

## 2016-05-07 NOTE — Discharge Instructions (Signed)
1. Take prednisone 60 mg daily 5 days. 2. Use albuterol inhaler 2 puffs every 4 hours as needed for wheezing/breathing difficulty. 3. Your Advair prescription has been renewed. 4. Return to the ER for worsening symptoms, persistent vomiting, difficulty breathing or other concerns.

## 2016-06-18 ENCOUNTER — Ambulatory Visit
Admission: RE | Admit: 2016-06-18 | Discharge: 2016-06-18 | Disposition: A | Discharge status: Home / Self Care | Payer: Medicare Other | Source: Ambulatory Visit | Attending: Family Medicine | Admitting: Family Medicine

## 2016-06-18 DIAGNOSIS — Z1231 Encounter for screening mammogram for malignant neoplasm of breast: Secondary | ICD-10-CM | POA: Diagnosis present

## 2016-10-10 ENCOUNTER — Other Ambulatory Visit: Payer: Self-pay | Admitting: Gastroenterology

## 2016-10-10 DIAGNOSIS — R112 Nausea with vomiting, unspecified: Secondary | ICD-10-CM

## 2016-10-16 ENCOUNTER — Ambulatory Visit
Admission: RE | Admit: 2016-10-16 | Discharge: 2016-10-16 | Disposition: A | Discharge status: Home / Self Care | Payer: Medicare Other | Source: Ambulatory Visit | Attending: Gastroenterology | Admitting: Gastroenterology

## 2016-10-16 DIAGNOSIS — R112 Nausea with vomiting, unspecified: Secondary | ICD-10-CM | POA: Diagnosis present

## 2016-10-16 DIAGNOSIS — K449 Diaphragmatic hernia without obstruction or gangrene: Secondary | ICD-10-CM | POA: Insufficient documentation

## 2016-12-25 ENCOUNTER — Other Ambulatory Visit: Payer: Self-pay | Admitting: Surgery

## 2016-12-25 DIAGNOSIS — M19011 Primary osteoarthritis, right shoulder: Secondary | ICD-10-CM

## 2016-12-25 DIAGNOSIS — M7581 Other shoulder lesions, right shoulder: Secondary | ICD-10-CM

## 2016-12-31 ENCOUNTER — Encounter
Admission: RE | Admit: 2016-12-31 | Discharge: 2016-12-31 | Disposition: A | Discharge status: Home / Self Care | Payer: Medicare Other | Source: Ambulatory Visit | Attending: Surgery | Admitting: Surgery

## 2016-12-31 DIAGNOSIS — M7581 Other shoulder lesions, right shoulder: Secondary | ICD-10-CM | POA: Diagnosis present

## 2016-12-31 DIAGNOSIS — M25711 Osteophyte, right shoulder: Secondary | ICD-10-CM | POA: Diagnosis not present

## 2016-12-31 DIAGNOSIS — M19011 Primary osteoarthritis, right shoulder: Secondary | ICD-10-CM | POA: Diagnosis present

## 2016-12-31 DIAGNOSIS — I7090 Unspecified atherosclerosis: Secondary | ICD-10-CM | POA: Diagnosis not present

## 2016-12-31 HISTORY — DX: Personal history of urinary calculi: Z87.442

## 2016-12-31 HISTORY — DX: Gastro-esophageal reflux disease without esophagitis: K21.9

## 2016-12-31 HISTORY — DX: Personal history of other diseases of the digestive system: Z87.19

## 2016-12-31 HISTORY — DX: Prediabetes: R73.03

## 2016-12-31 HISTORY — DX: Unspecified osteoarthritis, unspecified site: M19.90

## 2016-12-31 LAB — URINALYSIS, ROUTINE W REFLEX MICROSCOPIC
Bilirubin Urine: NEGATIVE
Glucose, UA: NEGATIVE mg/dL
Hgb urine dipstick: NEGATIVE
Ketones, ur: NEGATIVE mg/dL
Leukocytes, UA: NEGATIVE
Nitrite: NEGATIVE
Protein, ur: NEGATIVE mg/dL
Specific Gravity, Urine: 1.014 (ref 1.005–1.030)
pH: 7 (ref 5.0–8.0)

## 2016-12-31 LAB — BASIC METABOLIC PANEL WITH GFR
Anion gap: 10 (ref 5–15)
BUN: 18 mg/dL (ref 6–20)
CO2: 30 mmol/L (ref 22–32)
Calcium: 9.8 mg/dL (ref 8.9–10.3)
Chloride: 98 mmol/L — ABNORMAL LOW (ref 101–111)
Creatinine, Ser: 0.72 mg/dL (ref 0.44–1.00)
GFR calc Af Amer: >60 mL/min
GFR calc non Af Amer: >60 mL/min
Glucose, Bld: 92 mg/dL (ref 65–99)
Potassium: 3.8 mmol/L (ref 3.5–5.1)
Sodium: 138 mmol/L (ref 135–145)

## 2016-12-31 LAB — CBC
HCT: 43.6 % (ref 35.0–47.0)
Hemoglobin: 14.7 g/dL (ref 12.0–16.0)
MCH: 31.1 pg (ref 26.0–34.0)
MCHC: 33.7 g/dL (ref 32.0–36.0)
MCV: 92.5 fL (ref 80.0–100.0)
Platelets: 236 K/uL (ref 150–440)
RBC: 4.71 MIL/uL (ref 3.80–5.20)
RDW: 13.5 % (ref 11.5–14.5)
WBC: 9.8 K/uL (ref 3.6–11.0)

## 2016-12-31 LAB — TYPE AND SCREEN
ABO/RH(D): A POS
Antibody Screen: NEGATIVE

## 2016-12-31 LAB — SURGICAL PCR SCREEN
MRSA, PCR: NEGATIVE
Staphylococcus aureus: NEGATIVE

## 2016-12-31 LAB — PROTIME-INR
INR: 0.91
Prothrombin Time: 12.2 s (ref 11.4–15.2)

## 2016-12-31 NOTE — Patient Instructions (Signed)
Your procedure is scheduled on: Thursday Oct. 25, 2019. Report to Same Day Surgery. To find out your arrival time please call [phone] between 1PM - 3PM on Wednesday Jan 09, 2017.  Remember: Instructions that are not followed completely may result in serious medical risk, up to and including death, or upon the discretion of your surgeon and anesthesiologist your surgery may need to be rescheduled.     _X__ 1. Do not eat food after midnight the night before your procedure.                 No gum chewing or hard candies. You may drink clear liquids up to 2 hours                 before you are scheduled to arrive for your surgery- DO not drink clear                 liquids within 2 hours of the start of your surgery.                 Clear Liquids include:  water, apple juice without pulp, clear carbohydrate                 drink such as Clearfast of Gart[place], Black Coffee or Tea (Do not add                 anything to coffee or tea).     ___ 2.  No Alcohol for 24 hours before or after surgery.   ___ 3.  Do Not Smoke or use e-cigarettes For 24 Hours Prior to Your Surgery.                 Do not use any chewable tobacco products for at least 6 hours prior to                 surgery.  ____  4.  Bring all medications with you on the day of surgery if instructed.   __x__  5.  Notify your doctor if there is any change in your medical condition      (cold, fever, infections).     Do not wear jewelry, make-up, hairpins, clips or nail polish. Do not wear lotions, powders, or perfumes. You may wear deodorant. Do not shave 48 hours prior to surgery. Men may shave face and neck. Do not bring valuables to the hospital.    [facility] is not responsible for any belongings or valuables.  Contacts, dentures or bridgework may not be worn into surgery. Leave your suitcase in the car. After surgery it may be brought to your room. For patients admitted to the hospital,  discharge time is determined by your treatment team.   Patients discharged the day of surgery will not be allowed to drive home.   Please read over the following fact sheets that you were given:   Preparing for surgery.   _x___ Take these medicines the morning of surgery with A SIP OF WATER:    1. amLODipine (NORVASC)  2. pantoprazole (PROTONIX), take an extra dose at bedtime the night prior to surgery.   ____ Fleet Enema (as directed)   _x___ Use CHG Soap as directed  _x___ Use inhalers on the day of surgery  ____ Stop metformin 2 days prior to surgery    ____ Take 1/2 of usual insulin dose the night before surgery. No insulin the morning  of surgery.   _x_ Stop aspirin now.  __x__ Stop Anti-inflammatories: Aleve, ibuprofen, Advil, motrin, naprosyn or goodies head ache powders now.  It is OK to  take Tylenol for pain.    ____ Stop supplements until after surgery.    ____ Bring C-Pap to the hospital.

## 2017-01-01 ENCOUNTER — Inpatient Hospital Stay: Admission: RE | Admit: 2017-01-01 | Payer: Medicare Other | Source: Ambulatory Visit

## 2017-01-01 NOTE — Pre-Procedure Instructions (Signed)
EKG's compared by Lucina Mellow RN, no change, OK to proceed.

## 2017-01-02 LAB — URINE CULTURE: CULTURE: NO GROWTH

## 2017-01-02 NOTE — Care Management (Signed)
Pre-admit for shoulder surgery 01/10/17: Patient states she is from home alone. She seems anxious about discharge planning. Patient seems to think she has insurance to cover up to 8 hours/day but they did not give her an agency name. She is dominate right handed and that is the shoulder she plans to have worked. She has been to [facility] place in the past.  Patient is hard of hearing. She is living on a fixed income. She has a daughter but she works and not available to help her.  I have contacted [facility] [phone] to check benefits Ulice Dash):  Home health PT/Aid: 100% no co-pays. Skilled nursing/NA: <8hr/day but less than 35 hrs per week- auth is not required. Got to be a skilled need- not custodial care.  Private duty nurse: NO BENEFITS SNF: 100% 1-20 days; 21-49-160$/day; 50-100 $0/day 3 day not required however authorization for SNF required.  EMS: 225$ copay required.  DME: 20% co insurance.   I have explained this to patient and encouraged her to wait and see what her needs may be. She would like to go to [facility] if she can.

## 2017-01-03 ENCOUNTER — Ambulatory Visit
Admission: RE | Admit: 2017-01-03 | Discharge: 2017-01-03 | Disposition: A | Discharge status: Home / Self Care | Payer: Medicare Other | Source: Ambulatory Visit | Attending: Surgery | Admitting: Surgery

## 2017-01-03 DIAGNOSIS — M19011 Primary osteoarthritis, right shoulder: Secondary | ICD-10-CM | POA: Insufficient documentation

## 2017-01-03 DIAGNOSIS — M25711 Osteophyte, right shoulder: Secondary | ICD-10-CM | POA: Insufficient documentation

## 2017-01-03 DIAGNOSIS — I7090 Unspecified atherosclerosis: Secondary | ICD-10-CM | POA: Insufficient documentation

## 2017-01-03 DIAGNOSIS — M7581 Other shoulder lesions, right shoulder: Secondary | ICD-10-CM | POA: Diagnosis not present

## 2017-01-06 IMAGING — CR DG HIP (WITH OR WITHOUT PELVIS) 2-3V*L*
1 series · 4 of 4 positions shown · non-contrast
Comparison: Abdominal radiograph 08/13/2012

CLINICAL DATA: Fall now with left hip pain and tenderness about the
proximal femur. Tripped over rug earlier this afternoon.

EXAM:
DG HIP (WITH OR WITHOUT PELVIS) 2-3V LEFT

[Series 1: dg hip unilat with pelvis 2-3 views left · 0.14mm/px · 4 of 4 slices shown]
[im 1/4]
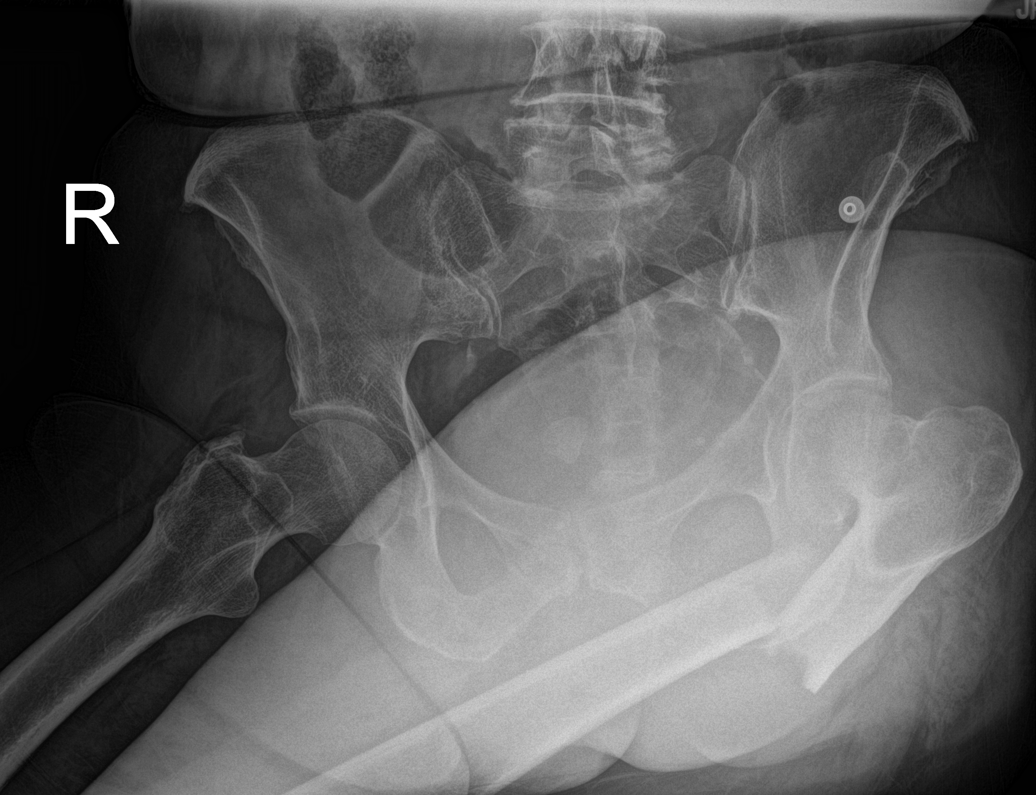
[im 2/4]
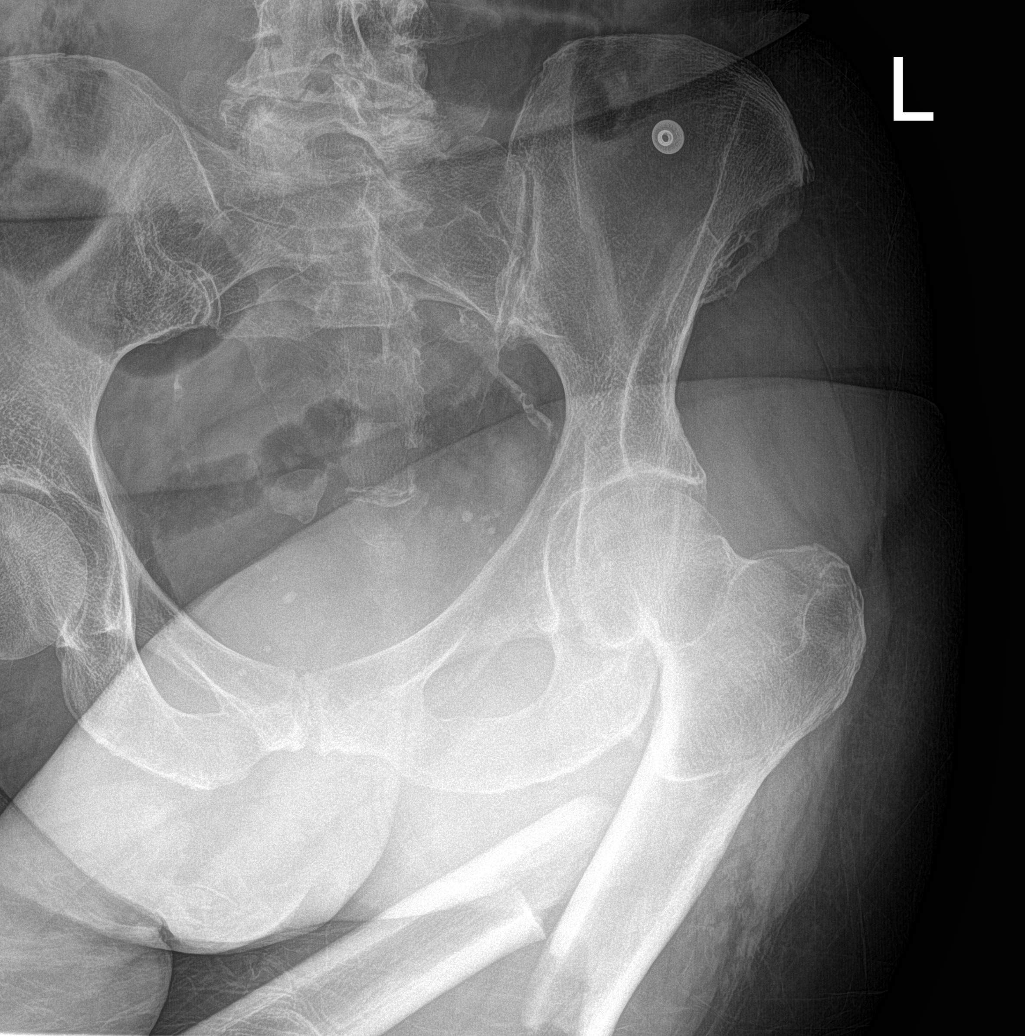
[im 3/4]
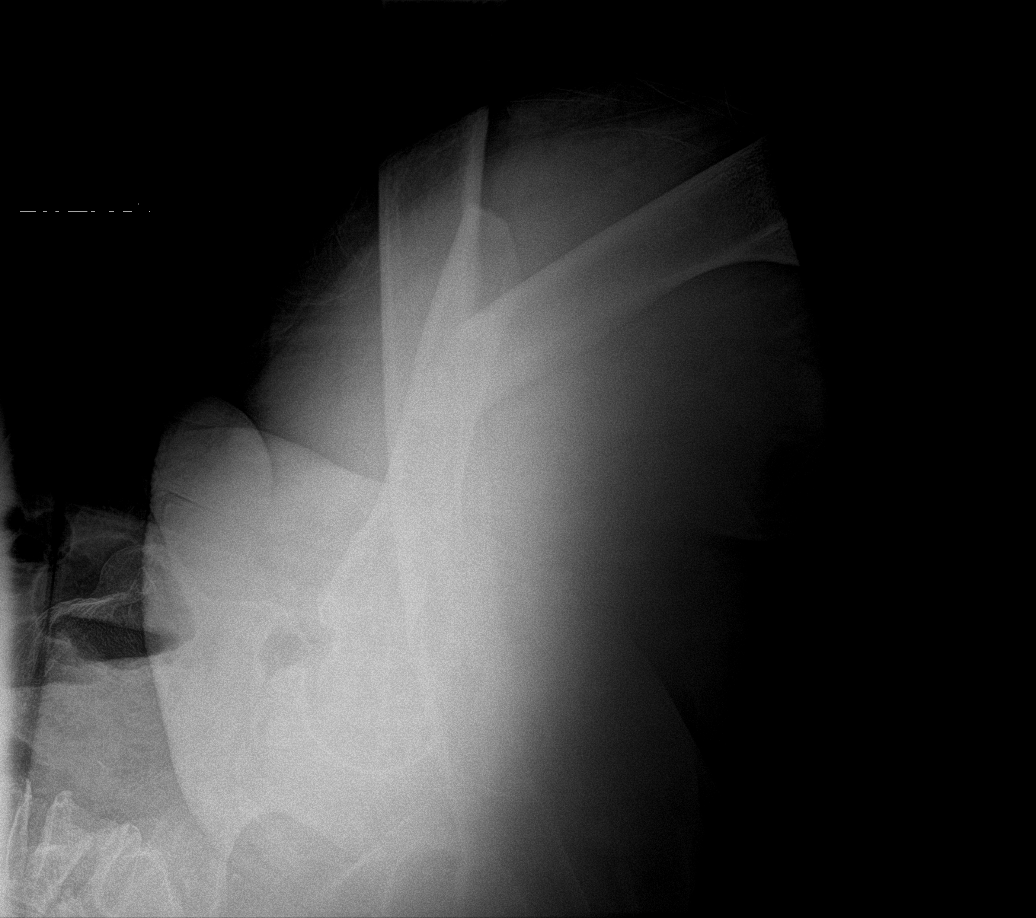
[im 4/4]
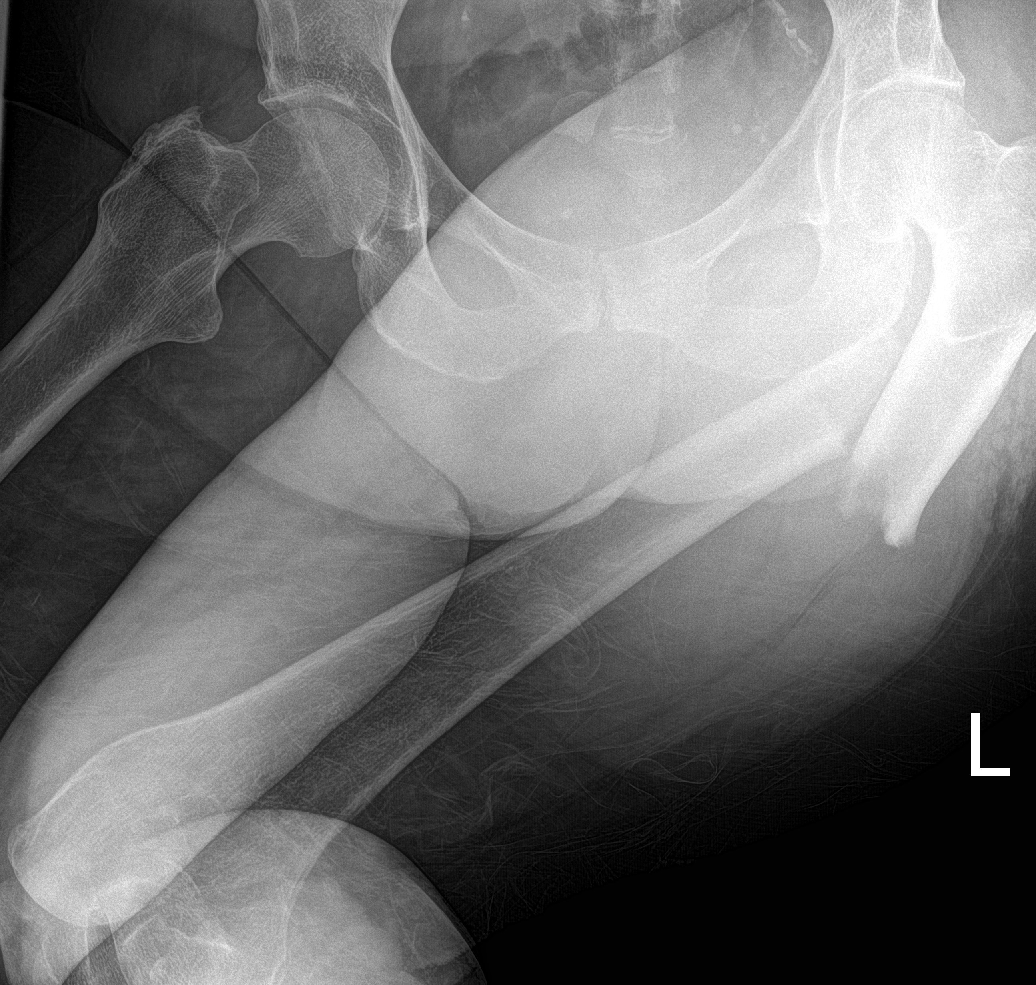

[4 of 4 positions shown; findings below may reference images not displayed]

FINDINGS: Displaced angulated fracture of the proximal femoral diaphysis.
There is greater than 1 shaft width medial displacement of main
femoral shaft and at least 3 cm osseous overriding. No definite
intertrochanteric extension. The femoral head remains seated in the
acetabulum. Pubic rami appear intact. Pelvic calcifications likely
combination of phlebolith, vascular, and questionable fibroid,
unchanged.
IMPRESSION: Displaced angulated proximal femoral diaphyseal fracture with
osseous overriding. No definite extension into the intertrochanteric
femur.

## 2017-01-06 IMAGING — CR DG CHEST 1V
1 series · 1 of 1 positions shown · non-contrast
Comparison: 05/12/2012

CLINICAL DATA: Preoperative

EXAM:
CHEST  1 VIEW

[dg chest 1 view]
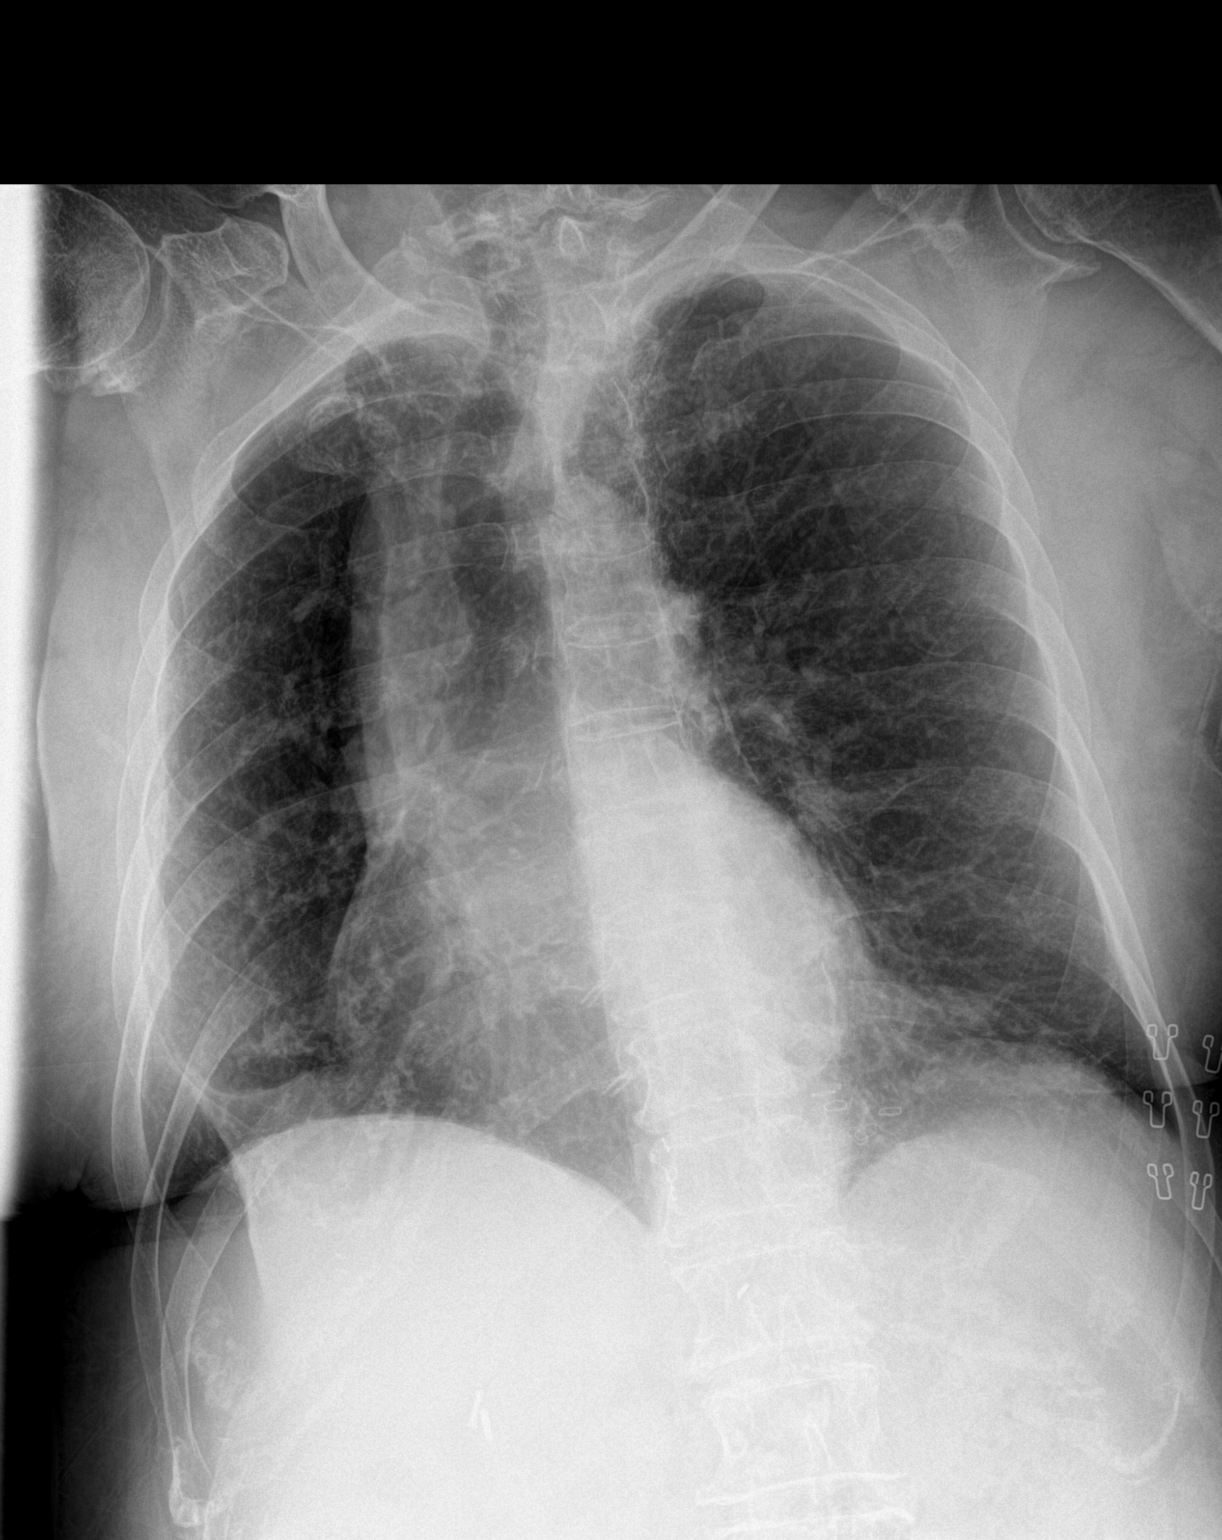

[1 of 1 positions shown; findings below may reference images not displayed]

FINDINGS: There is hyperinflation and chronic appearing interstitial
coarsening. There is no confluent airspace consolidation. There is
no large effusion. There is no pneumothorax.
IMPRESSION: Hyperinflation.  Chronic appearing interstitial coarsening.

## 2017-01-10 ENCOUNTER — Encounter
Admission: RE | Disposition: A | Discharge status: Skilled Nursing Facility | Payer: Self-pay | Source: Ambulatory Visit | Attending: Surgery

## 2017-01-10 ENCOUNTER — Inpatient Hospital Stay: Payer: Medicare Other | Admitting: Anesthesiology

## 2017-01-10 ENCOUNTER — Encounter: Payer: Self-pay | Admitting: Anesthesiology

## 2017-01-10 ENCOUNTER — Inpatient Hospital Stay: Payer: Medicare Other

## 2017-01-10 ENCOUNTER — Inpatient Hospital Stay
Admission: RE | Admit: 2017-01-10 | Discharge: 2017-01-11 | DRG: 483 | Disposition: A | Discharge status: Skilled Nursing Facility | Payer: Medicare Other | Source: Ambulatory Visit | Attending: Surgery | Admitting: Surgery

## 2017-01-10 DIAGNOSIS — Z87891 Personal history of nicotine dependence: Secondary | ICD-10-CM

## 2017-01-10 DIAGNOSIS — K219 Gastro-esophageal reflux disease without esophagitis: Secondary | ICD-10-CM | POA: Diagnosis present

## 2017-01-10 DIAGNOSIS — R7303 Prediabetes: Secondary | ICD-10-CM | POA: Diagnosis present

## 2017-01-10 DIAGNOSIS — Z8673 Personal history of transient ischemic attack (TIA), and cerebral infarction without residual deficits: Secondary | ICD-10-CM | POA: Diagnosis not present

## 2017-01-10 DIAGNOSIS — J449 Chronic obstructive pulmonary disease, unspecified: Secondary | ICD-10-CM | POA: Diagnosis present

## 2017-01-10 DIAGNOSIS — Z923 Personal history of irradiation: Secondary | ICD-10-CM | POA: Diagnosis not present

## 2017-01-10 DIAGNOSIS — Z853 Personal history of malignant neoplasm of breast: Secondary | ICD-10-CM

## 2017-01-10 DIAGNOSIS — M75101 Unspecified rotator cuff tear or rupture of right shoulder, not specified as traumatic: Secondary | ICD-10-CM | POA: Diagnosis present

## 2017-01-10 DIAGNOSIS — M19011 Primary osteoarthritis, right shoulder: Secondary | ICD-10-CM | POA: Diagnosis present

## 2017-01-10 DIAGNOSIS — I10 Essential (primary) hypertension: Secondary | ICD-10-CM | POA: Diagnosis present

## 2017-01-10 DIAGNOSIS — Z96611 Presence of right artificial shoulder joint: Secondary | ICD-10-CM

## 2017-01-10 HISTORY — PX: SHOULDER OPEN ROTATOR CUFF REPAIR: SHX2407

## 2017-01-10 HISTORY — PX: TOTAL SHOULDER ARTHROPLASTY: SHX126

## 2017-01-10 SURGERY — ARTHROPLASTY, SHOULDER, TOTAL
Anesthesia: General | Site: Shoulder | Laterality: Right

## 2017-01-10 MED ORDER — ROCURONIUM BROMIDE 100 MG/10ML IV SOLN
INTRAVENOUS | Status: DC | PRN
  Administered 2017-01-10: 5 mg via INTRAVENOUS
  Administered 2017-01-10: 40 mg via INTRAVENOUS
  Administered 2017-01-10: 5 mg via INTRAVENOUS
  Administered 2017-01-10: 10 mg via INTRAVENOUS

## 2017-01-10 MED ORDER — METOCLOPRAMIDE HCL 10 MG PO TABS
5.0000 mg | ORAL_TABLET | Freq: Three times a day (TID) | ORAL | Status: DC | PRN
  Administered 2017-01-10: 10 mg via ORAL
  Filled 2017-01-10: qty 1

## 2017-01-10 MED ORDER — ENOXAPARIN SODIUM 40 MG/0.4ML ~~LOC~~ SOLN
40.0000 mg | SUBCUTANEOUS | Status: DC
  Administered 2017-01-11: 40 mg via SUBCUTANEOUS
  Filled 2017-01-10: qty 0.4

## 2017-01-10 MED ORDER — MIDAZOLAM HCL 2 MG/2ML IJ SOLN
INTRAMUSCULAR | Status: AC
  Administered 2017-01-10: 1 mg via INTRAVENOUS
  Filled 2017-01-10: qty 2

## 2017-01-10 MED ORDER — TRANEXAMIC ACID 1000 MG/10ML IV SOLN
INTRAVENOUS | Status: DC | PRN
  Administered 2017-01-10: 1000 mg via INTRAVENOUS

## 2017-01-10 MED ORDER — FENTANYL CITRATE (PF) 100 MCG/2ML IJ SOLN: 25.0000 ug | INTRAMUSCULAR | Status: DC | PRN

## 2017-01-10 MED ORDER — ONDANSETRON HCL 4 MG/2ML IJ SOLN
INTRAMUSCULAR | Status: AC
  Filled 2017-01-10: qty 2

## 2017-01-10 MED ORDER — SODIUM CHLORIDE 0.9 % IV SOLN
INTRAVENOUS | Status: DC | PRN
  Administered 2017-01-10: 20 ug/min via INTRAVENOUS

## 2017-01-10 MED ORDER — SODIUM CHLORIDE 0.9 % IV SOLN
INTRAVENOUS | Status: DC | PRN
  Administered 2017-01-10: 60 mL

## 2017-01-10 MED ORDER — METOCLOPRAMIDE HCL 10 MG PO TABS
5.0000 mg | ORAL_TABLET | Freq: Three times a day (TID) | ORAL | Status: DC
  Administered 2017-01-10 – 2017-01-11 (×3): 5 mg via ORAL
  Filled 2017-01-10 (×2): qty 1

## 2017-01-10 MED ORDER — FENTANYL CITRATE (PF) 100 MCG/2ML IJ SOLN
INTRAMUSCULAR | Status: AC
  Filled 2017-01-10: qty 2

## 2017-01-10 MED ORDER — OXYCODONE HCL 5 MG PO TABS
5.0000 mg | ORAL_TABLET | ORAL | Status: DC | PRN
  Administered 2017-01-10: 5 mg via ORAL
  Filled 2017-01-10: qty 1

## 2017-01-10 MED ORDER — PROPOFOL 10 MG/ML IV BOLUS
INTRAVENOUS | Status: DC | PRN
  Administered 2017-01-10: 110 mg via INTRAVENOUS
  Administered 2017-01-10: 20 mg via INTRAVENOUS

## 2017-01-10 MED ORDER — FENTANYL CITRATE (PF) 100 MCG/2ML IJ SOLN
INTRAMUSCULAR | Status: AC
  Administered 2017-01-10: 50 ug via INTRAVENOUS
  Filled 2017-01-10: qty 2

## 2017-01-10 MED ORDER — MIDAZOLAM HCL 2 MG/2ML IJ SOLN
1.0000 mg | Freq: Once | INTRAMUSCULAR | Status: AC
  Administered 2017-01-10: 1 mg via INTRAVENOUS

## 2017-01-10 MED ORDER — BUPIVACAINE LIPOSOME 1.3 % IJ SUSP
INTRAMUSCULAR | Status: AC
  Filled 2017-01-10: qty 20

## 2017-01-10 MED ORDER — ACETAMINOPHEN 650 MG RE SUPP: 650.0000 mg | RECTAL | Status: DC | PRN

## 2017-01-10 MED ORDER — ACETAMINOPHEN 500 MG PO TABS
1000.0000 mg | ORAL_TABLET | Freq: Four times a day (QID) | ORAL | Status: AC
  Administered 2017-01-10 – 2017-01-11 (×2): 1000 mg via ORAL
  Filled 2017-01-10 (×3): qty 2

## 2017-01-10 MED ORDER — ACETAMINOPHEN 325 MG PO TABS
650.0000 mg | ORAL_TABLET | ORAL | Status: DC | PRN
Start: 2017-01-10 — End: 2017-01-11
  Administered 2017-01-11: 650 mg via ORAL

## 2017-01-10 MED ORDER — LIDOCAINE HCL (PF) 2 % IJ SOLN
INTRAMUSCULAR | Status: AC
  Filled 2017-01-10: qty 10

## 2017-01-10 MED ORDER — ONDANSETRON HCL 4 MG/2ML IJ SOLN: 4.0000 mg | Freq: Once | INTRAMUSCULAR | Status: DC | PRN

## 2017-01-10 MED ORDER — LACTATED RINGERS IV SOLN
INTRAVENOUS | Status: DC
  Administered 2017-01-10: 06:00:00 via INTRAVENOUS

## 2017-01-10 MED ORDER — PHENYLEPHRINE HCL 10 MG/ML IJ SOLN
INTRAMUSCULAR | Status: AC
  Filled 2017-01-10: qty 1

## 2017-01-10 MED ORDER — SUCCINYLCHOLINE CHLORIDE 20 MG/ML IJ SOLN
INTRAMUSCULAR | Status: AC
  Filled 2017-01-10: qty 1

## 2017-01-10 MED ORDER — BUPIVACAINE-EPINEPHRINE (PF) 0.5% -1:200000 IJ SOLN
INTRAMUSCULAR | Status: AC
  Filled 2017-01-10: qty 30

## 2017-01-10 MED ORDER — ERYTHROMYCIN 5 MG/GM OP OINT
TOPICAL_OINTMENT | OPHTHALMIC | Status: DC
  Administered 2017-01-10 – 2017-01-11 (×5): 1 via OPHTHALMIC
  Filled 2017-01-10: qty 3.5

## 2017-01-10 MED ORDER — PHENYLEPHRINE HCL 10 MG/ML IJ SOLN
INTRAMUSCULAR | Status: DC | PRN
  Administered 2017-01-10 (×2): 100 ug via INTRAVENOUS
  Administered 2017-01-10 (×3): 50 ug via INTRAVENOUS

## 2017-01-10 MED ORDER — DOCUSATE SODIUM 100 MG PO CAPS
100.0000 mg | ORAL_CAPSULE | Freq: Two times a day (BID) | ORAL | Status: DC
  Administered 2017-01-10 – 2017-01-11 (×2): 100 mg via ORAL
  Filled 2017-01-10 (×2): qty 1

## 2017-01-10 MED ORDER — CALCIUM-VITAMIN D 500-200 MG-UNIT PO TABS
2.0000 | ORAL_TABLET | Freq: Every day | ORAL | Status: DC
  Administered 2017-01-11: 2 via ORAL
  Filled 2017-01-10 (×2): qty 2

## 2017-01-10 MED ORDER — VITAMIN D (ERGOCALCIFEROL) 1.25 MG (50000 UNIT) PO CAPS: 50000.0000 [IU] | ORAL_CAPSULE | ORAL | Status: DC

## 2017-01-10 MED ORDER — METOCLOPRAMIDE HCL 5 MG/ML IJ SOLN: 5.0000 mg | Freq: Three times a day (TID) | INTRAMUSCULAR | Status: DC | PRN

## 2017-01-10 MED ORDER — LIDOCAINE HCL (PF) 1 % IJ SOLN
INTRAMUSCULAR | Status: AC
  Filled 2017-01-10: qty 5

## 2017-01-10 MED ORDER — OXYCODONE HCL 5 MG PO TABS
10.0000 mg | ORAL_TABLET | ORAL | Status: DC | PRN
  Administered 2017-01-10 (×2): 10 mg via ORAL
  Filled 2017-01-10 (×2): qty 2

## 2017-01-10 MED ORDER — HYDROMORPHONE HCL 1 MG/ML IJ SOLN: 0.5000 mg | INTRAMUSCULAR | Status: DC | PRN

## 2017-01-10 MED ORDER — CEFAZOLIN SODIUM-DEXTROSE 2-4 GM/100ML-% IV SOLN: 2.0000 g | Freq: Four times a day (QID) | INTRAVENOUS | Status: DC

## 2017-01-10 MED ORDER — ALBUTEROL SULFATE (2.5 MG/3ML) 0.083% IN NEBU: 3.0000 mL | INHALATION_SOLUTION | RESPIRATORY_TRACT | Status: DC | PRN

## 2017-01-10 MED ORDER — ROPIVACAINE HCL 5 MG/ML IJ SOLN
INTRAMUSCULAR | Status: AC
  Filled 2017-01-10: qty 30

## 2017-01-10 MED ORDER — MORPHINE SULFATE (PF) 4 MG/ML IV SOLN
INTRAVENOUS | Status: AC
  Filled 2017-01-10: qty 1

## 2017-01-10 MED ORDER — BUPIVACAINE-EPINEPHRINE (PF) 0.5% -1:200000 IJ SOLN
INTRAMUSCULAR | Status: DC | PRN
  Administered 2017-01-10: 30 mL via PERINEURAL

## 2017-01-10 MED ORDER — ONDANSETRON HCL 4 MG/2ML IJ SOLN: 4.0000 mg | Freq: Four times a day (QID) | INTRAMUSCULAR | Status: DC | PRN

## 2017-01-10 MED ORDER — KCL IN DEXTROSE-NACL 20-5-0.9 MEQ/L-%-% IV SOLN
INTRAVENOUS | Status: DC
  Administered 2017-01-10: 15:00:00 via INTRAVENOUS
  Filled 2017-01-10 (×4): qty 1000

## 2017-01-10 MED ORDER — MOMETASONE FURO-FORMOTEROL FUM 200-5 MCG/ACT IN AERO
2.0000 | INHALATION_SPRAY | Freq: Two times a day (BID) | RESPIRATORY_TRACT | Status: DC
  Administered 2017-01-10 – 2017-01-11 (×2): 2 via RESPIRATORY_TRACT
  Filled 2017-01-10: qty 8.8

## 2017-01-10 MED ORDER — NEOMYCIN-POLYMYXIN B GU 40-200000 IR SOLN
Status: AC
  Filled 2017-01-10: qty 20

## 2017-01-10 MED ORDER — BISACODYL 10 MG RE SUPP: 10.0000 mg | Freq: Every day | RECTAL | Status: DC | PRN

## 2017-01-10 MED ORDER — PANTOPRAZOLE SODIUM 40 MG PO TBEC
40.0000 mg | DELAYED_RELEASE_TABLET | Freq: Every day | ORAL | Status: DC
  Administered 2017-01-11: 40 mg via ORAL
  Filled 2017-01-10: qty 1

## 2017-01-10 MED ORDER — SUCCINYLCHOLINE CHLORIDE 20 MG/ML IJ SOLN
INTRAMUSCULAR | Status: DC | PRN
  Administered 2017-01-10: 70 mg via INTRAVENOUS

## 2017-01-10 MED ORDER — ASPIRIN EC 81 MG PO TBEC
81.0000 mg | DELAYED_RELEASE_TABLET | Freq: Every day | ORAL | Status: DC
  Administered 2017-01-11: 81 mg via ORAL
  Filled 2017-01-10: qty 1

## 2017-01-10 MED ORDER — ROCURONIUM BROMIDE 50 MG/5ML IV SOLN
INTRAVENOUS | Status: AC
  Filled 2017-01-10: qty 1

## 2017-01-10 MED ORDER — DEXTROSE 5 % IV SOLN
2.0000 g | Freq: Four times a day (QID) | INTRAVENOUS | Status: AC
  Administered 2017-01-10 – 2017-01-11 (×3): 2 g via INTRAVENOUS
  Filled 2017-01-10 (×3): qty 2000

## 2017-01-10 MED ORDER — KETOROLAC TROMETHAMINE 0.5 % OP SOLN
1.0000 [drp] | Freq: Four times a day (QID) | OPHTHALMIC | Status: DC
  Administered 2017-01-10: 1 [drp] via OPHTHALMIC
  Filled 2017-01-10: qty 3

## 2017-01-10 MED ORDER — FENTANYL CITRATE (PF) 100 MCG/2ML IJ SOLN
INTRAMUSCULAR | Status: DC | PRN
  Administered 2017-01-10: 25 ug via INTRAVENOUS
  Administered 2017-01-10: 100 ug via INTRAVENOUS
  Administered 2017-01-10: 50 ug via INTRAVENOUS

## 2017-01-10 MED ORDER — FENTANYL CITRATE (PF) 100 MCG/2ML IJ SOLN
50.0000 ug | Freq: Once | INTRAMUSCULAR | Status: AC
  Administered 2017-01-10: 50 ug via INTRAVENOUS

## 2017-01-10 MED ORDER — CEFAZOLIN SODIUM-DEXTROSE 2-4 GM/100ML-% IV SOLN
INTRAVENOUS | Status: AC
  Filled 2017-01-10: qty 100

## 2017-01-10 MED ORDER — POLYETHYL GLYCOL-PROPYL GLYCOL 0.4-0.3 % OP GEL: OPHTHALMIC | Status: DC | PRN

## 2017-01-10 MED ORDER — MORPHINE SULFATE (PF) 4 MG/ML IV SOLN
1.0000 mg | Freq: Once | INTRAVENOUS | Status: AC
  Administered 2017-01-10: 1 mg via INTRAVENOUS

## 2017-01-10 MED ORDER — DIPHENHYDRAMINE HCL 12.5 MG/5ML PO ELIX: 12.5000 mg | ORAL_SOLUTION | ORAL | Status: DC | PRN

## 2017-01-10 MED ORDER — FLEET ENEMA 7-19 GM/118ML RE ENEM: 1.0000 | ENEMA | Freq: Once | RECTAL | Status: DC | PRN

## 2017-01-10 MED ORDER — LIDOCAINE HCL (CARDIAC) 20 MG/ML IV SOLN
INTRAVENOUS | Status: DC | PRN
  Administered 2017-01-10: 40 mg via INTRAVENOUS

## 2017-01-10 MED ORDER — NEOMYCIN-POLYMYXIN B GU 40-200000 IR SOLN
Status: DC | PRN
  Administered 2017-01-10: 14 mL

## 2017-01-10 MED ORDER — MAGNESIUM HYDROXIDE 400 MG/5ML PO SUSP: 30.0000 mL | Freq: Every day | ORAL | Status: DC | PRN

## 2017-01-10 MED ORDER — HYDROCHLOROTHIAZIDE 25 MG PO TABS
25.0000 mg | ORAL_TABLET | Freq: Every day | ORAL | Status: DC
  Administered 2017-01-11: 25 mg via ORAL
  Filled 2017-01-10: qty 1

## 2017-01-10 MED ORDER — LORATADINE 10 MG PO TABS: 10.0000 mg | ORAL_TABLET | Freq: Every day | ORAL | Status: DC | PRN

## 2017-01-10 MED ORDER — PROPOFOL 10 MG/ML IV BOLUS
INTRAVENOUS | Status: AC
  Filled 2017-01-10: qty 20

## 2017-01-10 MED ORDER — SALINE SPRAY 0.65 % NA SOLN
1.0000 | NASAL | Status: DC | PRN
Start: 2017-01-10 — End: 2017-01-11
  Filled 2017-01-10: qty 44

## 2017-01-10 MED ORDER — SUGAMMADEX SODIUM 200 MG/2ML IV SOLN
INTRAVENOUS | Status: DC | PRN
  Administered 2017-01-10: 140 mg via INTRAVENOUS

## 2017-01-10 MED ORDER — PHENYLEPHRINE HCL 10 MG/ML IJ SOLN
INTRAMUSCULAR | Status: DC | PRN
  Administered 2017-01-10: .2 mL via TOPICAL

## 2017-01-10 MED ORDER — TRANEXAMIC ACID 1000 MG/10ML IV SOLN
INTRAVENOUS | Status: AC
  Filled 2017-01-10: qty 10

## 2017-01-10 MED ORDER — SODIUM CHLORIDE 0.9 % IJ SOLN
INTRAMUSCULAR | Status: AC
  Filled 2017-01-10: qty 50

## 2017-01-10 MED ORDER — CEFAZOLIN SODIUM-DEXTROSE 2-4 GM/100ML-% IV SOLN
2.0000 g | Freq: Once | INTRAVENOUS | Status: AC
  Administered 2017-01-10: 2 g via INTRAVENOUS

## 2017-01-10 MED ORDER — ONDANSETRON HCL 4 MG PO TABS
4.0000 mg | ORAL_TABLET | Freq: Four times a day (QID) | ORAL | Status: DC | PRN
  Administered 2017-01-10: 4 mg via ORAL
  Filled 2017-01-10: qty 1

## 2017-01-10 MED ORDER — AMLODIPINE BESYLATE 5 MG PO TABS
2.5000 mg | ORAL_TABLET | Freq: Every day | ORAL | Status: DC
  Administered 2017-01-11: 2.5 mg via ORAL
  Filled 2017-01-10: qty 1

## 2017-01-10 MED ORDER — IPRATROPIUM-ALBUTEROL 0.5-2.5 (3) MG/3ML IN SOLN: 3.0000 mL | RESPIRATORY_TRACT | Status: DC

## 2017-01-10 MED ORDER — BUPIVACAINE LIPOSOME 1.3 % IJ SUSP
INTRAMUSCULAR | Status: DC | PRN
Start: 2017-01-10 — End: 2017-01-10

## 2017-01-10 MED ORDER — ONDANSETRON HCL 4 MG/2ML IJ SOLN
INTRAMUSCULAR | Status: DC | PRN
  Administered 2017-01-10: 4 mg via INTRAVENOUS

## 2017-01-10 MED ORDER — SUGAMMADEX SODIUM 200 MG/2ML IV SOLN
INTRAVENOUS | Status: AC
  Filled 2017-01-10: qty 2

## 2017-01-10 SURGICAL SUPPLY — 62 items
ANCHOR JUGGERKNOT WTAP NDL 2.9 (Anchor) ×4 IMPLANT
ANCHOR SUT QUATTRO KNTLS 4.5 (Anchor) IMPLANT
BAG DECANTER FOR FLEXI CONT (MISCELLANEOUS) ×4 IMPLANT
BIT DRILL JUGRKNT W/NDL BIT2.9 (DRILL) ×2 IMPLANT
BLADE SAGITTAL WIDE XTHICK NO (BLADE) ×4 IMPLANT
BOWL CEMENT MIX W/ADAPTER (MISCELLANEOUS) ×4 IMPLANT
CANISTER SUCT 1200ML W/VALVE (MISCELLANEOUS) ×4 IMPLANT
CANISTER SUCT 3000ML PPV (MISCELLANEOUS) ×8 IMPLANT
CAPT SHLDR TOTAL 2 ×4 IMPLANT
CATH TRAY METER 16FR LF (MISCELLANEOUS) IMPLANT
CEMENT BONE R 1X40 (Cement) ×4 IMPLANT
CHLORAPREP W/TINT 26ML (MISCELLANEOUS) ×4 IMPLANT
COOLER POLAR GLACIER W/PUMP (MISCELLANEOUS) ×4 IMPLANT
DRAPE IMP U-DRAPE 54X76 (DRAPES) ×8 IMPLANT
DRAPE INCISE IOBAN 66X45 STRL (DRAPES) ×8 IMPLANT
DRAPE SHEET LG 3/4 BI-LAMINATE (DRAPES) ×8 IMPLANT
DRAPE TABLE BACK 80X90 (DRAPES) ×4 IMPLANT
DRILL JUGGERKNOT W/NDL BIT 2.9 (DRILL) ×4
DRSG OPSITE POSTOP 4X8 (GAUZE/BANDAGES/DRESSINGS) ×4 IMPLANT
ELECT CAUTERY BLADE 6.4 (BLADE) ×4 IMPLANT
GAUZE PACK 2X3YD (MISCELLANEOUS) ×4 IMPLANT
GLOVE BIO SURGEON STRL SZ7.5 (GLOVE) ×16 IMPLANT
GLOVE BIO SURGEON STRL SZ8 (GLOVE) ×16 IMPLANT
GLOVE BIOGEL PI IND STRL 8 (GLOVE) ×2 IMPLANT
GLOVE BIOGEL PI INDICATOR 8 (GLOVE) ×2
GLOVE INDICATOR 8.0 STRL GRN (GLOVE) ×4 IMPLANT
GOWN STRL REUS W/ TWL LRG LVL3 (GOWN DISPOSABLE) ×2 IMPLANT
GOWN STRL REUS W/ TWL XL LVL3 (GOWN DISPOSABLE) ×2 IMPLANT
GOWN STRL REUS W/TWL LRG LVL3 (GOWN DISPOSABLE) ×2
GOWN STRL REUS W/TWL XL LVL3 (GOWN DISPOSABLE) ×2
HOOD PEEL AWAY FLYTE STAYCOOL (MISCELLANEOUS) ×12 IMPLANT
KIT STABILIZATION SHOULDER (MISCELLANEOUS) ×4 IMPLANT
MASK FACE SPIDER DISP (MASK) ×4 IMPLANT
NDL MAYO CATGUT SZ1 (NEEDLE)
NDL MAYO CATGUT SZ5 (NEEDLE)
NDL SUT 5 .5 CRC TPR PNT MAYO (NEEDLE) IMPLANT
NEEDLE 18GX1X1/2 (RX/OR ONLY) (NEEDLE) ×4 IMPLANT
NEEDLE MAYO CATGUT SZ1 (NEEDLE) IMPLANT
NEEDLE MAYO CATGUT SZ4 (NEEDLE) IMPLANT
NEEDLE SPNL 20GX3.5 QUINCKE YW (NEEDLE) ×4 IMPLANT
NS IRRIG 500ML POUR BTL (IV SOLUTION) ×4 IMPLANT
PACK ARTHROSCOPY SHOULDER (MISCELLANEOUS) ×4 IMPLANT
PAD WRAPON POLAR SHDR UNIV (MISCELLANEOUS) ×2 IMPLANT
PIN HUMERAL STMN 3.2MMX9IN (INSTRUMENTS) IMPLANT
PULSAVAC PLUS IRRIG FAN TIP (DISPOSABLE) ×4
SLING ULTRA II M (MISCELLANEOUS) ×4 IMPLANT
SOL .9 NS 3000ML IRR  AL (IV SOLUTION) ×2
SOL .9 NS 3000ML IRR UROMATIC (IV SOLUTION) ×2 IMPLANT
SPONGE LAP 18X18 5 PK (GAUZE/BANDAGES/DRESSINGS) IMPLANT
STAPLER SKIN PROX 35W (STAPLE) ×4 IMPLANT
STRAP SAFETY BODY (MISCELLANEOUS) ×4 IMPLANT
SUT ETHIBOND 0 MO6 C/R (SUTURE) ×4 IMPLANT
SUT FIBERWIRE #2 38 BLUE 1/2 (SUTURE) ×8
SUT VIC AB 0 CT1 36 (SUTURE) ×8 IMPLANT
SUT VIC AB 2-0 CT1 27 (SUTURE) ×6
SUT VIC AB 2-0 CT1 TAPERPNT 27 (SUTURE) ×6 IMPLANT
SUTURE FIBERWR #2 38 BLUE 1/2 (SUTURE) ×4 IMPLANT
SYR 30ML LL (SYRINGE) ×8 IMPLANT
SYRINGE 10CC LL (SYRINGE) ×4 IMPLANT
SYRINGE IRR TOOMEY STRL 70CC (SYRINGE) ×4 IMPLANT
TIP FAN IRRIG PULSAVAC PLUS (DISPOSABLE) ×2 IMPLANT
WRAPON POLAR PAD SHDR UNIV (MISCELLANEOUS) ×4

## 2017-01-10 NOTE — Evaluation (Signed)
Physical Therapy Evaluation Patient Details Name: Yolanda Taylor MRN: [id number] DOB: 09/19/1930 Today's Date: 01/10/2017   History of Present Illness  83 y/o female s/p R total shoulder replacement this AM.   Clinical Impression  Pt extremely anxious t/o the PT exam and tearful on multiple different occasions.  Pt also hard of hearing which seemed to lead to some of her frustration.  Pt did relatively well with mobility and ~50 ft of ambulation with SPC, but was impulsive and showed poor safety awareness.  Pt very anxious/fearful about going home alone "with just one arm" and is hoping to go to short term rehab.  Daughter does work close and is able to go to her home during lunch as well as stay over night initially if needed.  She will need further assessment to see what appropriate discharge disposition will be.      Follow Up Recommendations DC plan and follow up therapy as arranged by surgeon (per progress, pt fearful of going home and wanting STR)    Equipment Recommendations       Recommendations for Other Services       Precautions / Restrictions Precautions Precautions: Shoulder Type of Shoulder Precautions: total shoulder Shoulder Interventions: Shoulder sling/immobilizer Restrictions Weight Bearing Restrictions: Yes RUE Weight Bearing: Non weight bearing      Mobility  Bed Mobility Overal bed mobility: Modified Independent             General bed mobility comments: Pt struggled to get to EOB w/o use of R UE, but needed only CGA to get to sitting  Transfers Overall transfer level: Modified independent Equipment used: Straight cane             General transfer comment: Pt able to rise to standing X2 during PT exam, pt not overly reliant on L UE/SPC but did need it for stability  Ambulation/Gait Ambulation/Gait assistance: Min assist Ambulation Distance (Feet): 50 Feet Assistive device: Straight cane       General Gait Details: Pt was able to  ambulate with minimal use of SPC, but was impulsive and anxious and did not show good safety awareness.  Pt needed cuing and encouragment to stay on task and be aware of her limitations. Pt complained of dizziness with both bouts of standing/walking (first attempt 15 ft to bathroom and back, then longer walk into the hallway)  Stairs            Wheelchair Mobility    Modified Rankin (Stroke Patients Only)       Balance Overall balance assessment: Modified Independent (impulsive with dynamic standing acts, but no LOBs)                                           Pertinent Vitals/Pain Pain Assessment:  (unrated, did not indicate a lot of pain)    Home Living Family/patient expects to be discharged to:: Skilled nursing facility Living Arrangements: Alone                    Prior Function Level of Independence: Independent with assistive device(s)         Comments: Pt uses cane, is able to grocery shop, run errands, etc     Hand Dominance        Extremity/Trunk Assessment   Upper Extremity Assessment Upper Extremity Assessment:  (Pt too anxious to do formal  testing on L, appears [facility])    Lower Extremity Assessment Lower Extremity Assessment: Overall WFL for tasks assessed       Communication   Communication: HOH  Cognition Arousal/Alertness: Awake/alert Behavior During Therapy: Anxious (Pt extremely anxious about her whole situation) Overall Cognitive Status: Difficult to assess                                 General Comments: Pt tearful and hyperanxious about nearly all aspects of PT exam      General Comments      Exercises     Assessment/Plan    PT Assessment Patient needs continued PT services  PT Problem List Decreased strength;Decreased range of motion;Decreased activity tolerance;Decreased balance;Decreased mobility;Decreased cognition;Decreased coordination;Decreased knowledge of use of DME;Decreased  safety awareness;Decreased knowledge of precautions;Pain       PT Treatment Interventions Therapeutic activities;DME instruction;Gait training;Stair training;Functional mobility training;Therapeutic exercise;Balance training;Neuromuscular re-education;Patient/family education;Cognitive remediation    PT Goals (Current goals can be found in the Care Plan section)  Acute Rehab PT Goals Patient Stated Goal: go to rehab PT Goal Formulation: With patient Time For Goal Achievement: 01/24/17 Potential to Achieve Goals: Fair    Frequency BID   Barriers to discharge Decreased caregiver support      Co-evaluation               AM-PAC PT "6 Clicks" Daily Activity  Outcome Measure Difficulty turning over in bed (including adjusting bedclothes, sheets and blankets)?: A Little Difficulty moving from lying on back to sitting on the side of the bed? : A Little Difficulty sitting down on and standing up from a chair with arms (e.g., wheelchair, bedside commode, etc,.)?: A Little Help needed moving to and from a bed to chair (including a wheelchair)?: A Little Help needed walking in hospital room?: A Little Help needed climbing 3-5 steps with a railing? : A Lot 6 Click Score: 17    End of Session Equipment Utilized During Treatment: Gait belt (L shoulder immob/sling) Activity Tolerance:  (limited due to anxiety ) Patient left: with chair alarm set;with call bell/phone within reach;with family/visitor present Nurse Communication: Mobility status PT Visit Diagnosis: Muscle weakness (generalized) (M62.81);Dizziness and giddiness (R42)    Time: 8185-6314 PT Time Calculation (min) (ACUTE ONLY): 24 min   Charges:   PT Evaluation $PT Eval Low Complexity: 1 Low     PT G Codes:   PT G-Codes **NOT FOR INPATIENT CLASS** Functional Assessment Tool Used: AM-PAC 6 Clicks Basic Mobility Functional Limitation: Mobility: Walking and moving around Mobility: Walking and Moving Around Current  Status (H7026): At least 40 percent but less than 60 percent impaired, limited or restricted Mobility: Walking and Moving Around Goal Status [phone]): At least 1 percent but less than 20 percent impaired, limited or restricted    Kreg Shropshire, DPT 01/10/2017, 5:36 PM

## 2017-01-10 NOTE — Anesthesia Preprocedure Evaluation (Signed)
Anesthesia Evaluation  Patient identified by MRN, date of birth, ID band Patient awake    Reviewed: Allergy & Precautions, NPO status , Patient's Chart, lab work & pertinent test results  Airway Mallampati: II       Dental  (+) Partial Upper, Partial Lower   Pulmonary COPD,  COPD inhaler, former smoker,     + decreased breath sounds      Cardiovascular hypertension, Normal cardiovascular exam     Neuro/Psych TIA   GI/Hepatic negative GI ROS, Neg liver ROS, hiatal hernia, GERD  Medicated,  Endo/Other  diabetes  Renal/GU negative Renal ROS     Musculoskeletal negative musculoskeletal ROS (+) Arthritis , Osteoarthritis,    Abdominal   Peds  Hematology negative hematology ROS (+)   Anesthesia Other Findings   Reproductive/Obstetrics                             Anesthesia Physical  Anesthesia Plan  ASA: III  Anesthesia Plan: General   Post-op Pain Management:    Induction: Intravenous  PONV Risk Score and Plan:   Airway Management Planned: Oral ETT  Additional Equipment:   Intra-op Plan:   Post-operative Plan: Extubation in OR  Informed Consent: I have reviewed the patients History and Physical, chart, labs and discussed the procedure including the risks, benefits and alternatives for the proposed anesthesia with the patient or authorized representative who has indicated his/her understanding and acceptance.     Plan Discussed with: CRNA  Anesthesia Plan Comments:         Anesthesia Quick Evaluation

## 2017-01-10 NOTE — Anesthesia Procedure Notes (Signed)
Anesthesia Regional Block: Interscalene brachial plexus block   Pre-Anesthetic Checklist: ,, timeout performed, Correct Patient, Correct Site, Correct Laterality, Correct Procedure, Correct Position, site marked, Risks and benefits discussed,  Surgical consent,  Pre-op evaluation,  At surgeon's request and post-op pain management  Laterality: Right  Prep: chloraprep, alcohol swabs       Needles:  Injection technique: Single-shot  Needle Type: Stimiplex     Needle Length: 5cm  Needle Gauge: 22     Additional Needles:   Procedures:, nerve stimulator,,, ultrasound used (permanent image in chart),,,,   Nerve Stimulator or Paresthesia:  Response: biceps flexion, 0.5 mA,   Additional Responses:   Narrative:  Start time: 01/10/2017 7:20 AM End time: 01/10/2017 7:30 AM Injection made incrementally with aspirations every 5 mL.  Performed by: Personally  Anesthesiologist: Alvin Critchley  Additional Notes: Functioning IV was confirmed and monitors were applied.  A 38mm 22ga Stimuplex needle was used. Sterile prep and drape,hand hygiene and sterile gloves were used.  Negative aspiration and negative test dose prior to incremental administration of local anesthetic. The patient tolerated the procedure well.  No pain on injection.  25 cc of plain Ropivacaine incrementally injected.  Easy injection

## 2017-01-10 NOTE — H&P (Signed)
Paper H&P to be scanned into permanent record. H&P reviewed and patient re-examined. No changes. 

## 2017-01-10 NOTE — Transfer of Care (Signed)
Immediate Anesthesia Transfer of Care Note  Patient: Yolanda Taylor  Procedure(s) Performed: TOTAL SHOULDER ARTHROPLASTY (Right ) ROTATOR CUFF REPAIR SHOULDER OPEN (Right Shoulder)  Patient Location: PACU  Anesthesia Type:General  Level of Consciousness: awake, alert  and responds to stimulation  Airway & Oxygen Therapy: Patient Spontanous Breathing and Patient connected to nasal cannula oxygen  Post-op Assessment: Report given to RN and Post -op Vital signs reviewed and stable  Post vital signs: Reviewed and stable  Last Vitals:  Vitals:   01/10/17 1047 01/10/17 1048  BP: 130/62 130/62  Pulse: 92 92  Resp: 18 19  Temp:    SpO2: 97% 97%    Last Pain:  Vitals:   01/10/17 0739  TempSrc:   PainSc: Asleep         Complications: No apparent anesthesia complications

## 2017-01-10 NOTE — NC FL2 (Signed)
[place] LEVEL OF CARE SCREENING TOOL     IDENTIFICATION  Patient Name: Yolanda Taylor Birthdate: 1930-12-18 Sex: female Admission Date (Current Location): 01/10/2017  [place] and [place] Number:  Engineering geologist and Address:  [facility], [address], [place], [place] [address]      Provider Number: [id number]  Attending Physician Name and Address:  Corky Mull, MD  Relative Name and Phone Number:       Current Level of Care: Hospital Recommended Level of Care: [place] Prior Approval Number:    Date Approved/Denied:   PASRR Number:   [id number]   Discharge Plan: SNF    Current Diagnoses: Patient Active Problem List   Diagnosis Date Noted  . Status post total shoulder arthroplasty, right 01/10/2017  . Femoral condyle fracture (HCC) 10/11/2014    Orientation RESPIRATION BLADDER Height & Weight     Self, Time, Situation, Place  Normal Continent Weight: 149 lb (67.6 kg) Height:  5\' 1"  (154.9 cm)  BEHAVIORAL SYMPTOMS/MOOD NEUROLOGICAL BOWEL NUTRITION STATUS      Continent Diet (Heart Healthy)  AMBULATORY STATUS COMMUNICATION OF NEEDS Skin   Extensive Assist Verbally Normal (Incision Right Shoulder)                       Personal Care Assistance Level of Assistance  Bathing, Feeding, Dressing Bathing Assistance: Limited assistance Feeding assistance: Independent Dressing Assistance: Limited assistance     Functional Limitations Info  Sight, Hearing, Speech Sight Info: Adequate Hearing Info: Adequate Speech Info: Adequate    SPECIAL CARE FACTORS FREQUENCY  PT (By licensed PT), OT (By licensed OT)     PT Frequency:  (5) OT Frequency:  (5)            Contractures      Additional Factors Info  Code Status, Allergies Code Status Info:  (Full Code) Allergies Info:  (PAROXETINE HCL )           Current Medications (01/10/2017):  This is the current hospital active  medication list Current Facility-Administered Medications  Medication Dose Route Frequency Provider Last Rate Last Dose  . acetaminophen (TYLENOL) tablet 650 mg  650 mg Oral Q4H PRN Poggi, Marshall Cork, MD       Or  . acetaminophen (TYLENOL) suppository 650 mg  650 mg Rectal Q4H PRN Poggi, Marshall Cork, MD      . acetaminophen (TYLENOL) tablet 1,000 mg  1,000 mg Oral Q6H Poggi, Marshall Cork, MD   1,000 mg at 01/10/17 1433  . albuterol (PROVENTIL) (2.5 MG/3ML) 0.083% nebulizer solution 3 mL  3 mL Inhalation Q4H PRN Poggi, Marshall Cork, MD      . Derrill Memo ON 01/11/2017] amLODipine (NORVASC) tablet 2.5 mg  2.5 mg Oral Daily Poggi, Marshall Cork, MD      . aspirin EC tablet 81 mg  81 mg Oral Daily Poggi, Marshall Cork, MD      . bisacodyl (DULCOLAX) suppository 10 mg  10 mg Rectal Daily PRN Poggi, Marshall Cork, MD      . calcium-vitamin D 500-200 MG-UNIT per tablet 2 tablet  2 tablet Oral Daily Poggi, Marshall Cork, MD      . ceFAZolin (ANCEF) 2 g in dextrose 5 % 100 mL IVPB  2 g Intravenous Q6H Poggi, Marshall Cork, MD 200 mL/hr at 01/10/17 1544 2 g at 01/10/17 1544  . dextrose 5 % and 0.9 % NaCl with KCl 20 mEq/L infusion  Intravenous Continuous Poggi, Marshall Cork, MD 75 mL/hr at 01/10/17 1433    . diphenhydrAMINE (BENADRYL) 12.5 MG/5ML elixir 12.5-25 mg  12.5-25 mg Oral Q4H PRN Poggi, Marshall Cork, MD      . docusate sodium (COLACE) capsule 100 mg  100 mg Oral BID Poggi, Marshall Cork, MD      . Derrill Memo ON 01/11/2017] enoxaparin (LOVENOX) injection 40 mg  40 mg Subcutaneous Q24H Poggi, Marshall Cork, MD      . erythromycin ophthalmic ointment   Left Eye Q4H Leandrew Koyanagi, MD   1 application at 35/70/17 1335  . hydrochlorothiazide (HYDRODIURIL) tablet 25 mg  25 mg Oral Daily Poggi, Marshall Cork, MD      . HYDROmorphone (DILAUDID) injection 0.5-1 mg  0.5-1 mg Intravenous Q2H PRN Poggi, Marshall Cork, MD      . lidocaine (PF) (XYLOCAINE) 1 % injection           . loratadine (CLARITIN) tablet 10 mg  10 mg Oral Daily PRN Poggi, Marshall Cork, MD      . magnesium hydroxide (MILK OF MAGNESIA)  suspension 30 mL  30 mL Oral Daily PRN Poggi, Marshall Cork, MD      . metoCLOPramide (REGLAN) tablet 5-10 mg  5-10 mg Oral Q8H PRN Poggi, Marshall Cork, MD       Or  . metoCLOPramide (REGLAN) injection 5-10 mg  5-10 mg Intravenous Q8H PRN Poggi, Marshall Cork, MD      . metoCLOPramide (REGLAN) tablet 5 mg  5 mg Oral TID AC & HS Poggi, Marshall Cork, MD      . mometasone-formoterol (DULERA) 200-5 MCG/ACT inhaler 2 puff  2 puff Inhalation BID Poggi, Marshall Cork, MD      . morphine 4 MG/ML injection           . ondansetron (ZOFRAN) tablet 4 mg  4 mg Oral Q6H PRN Poggi, Marshall Cork, MD       Or  . ondansetron (ZOFRAN) injection 4 mg  4 mg Intravenous Q6H PRN Poggi, Marshall Cork, MD      . oxyCODONE (Oxy IR/ROXICODONE) immediate release tablet 10 mg  10 mg Oral Q3H PRN Poggi, Marshall Cork, MD      . oxyCODONE (Oxy IR/ROXICODONE) immediate release tablet 5 mg  5 mg Oral Q3H PRN Poggi, Marshall Cork, MD      . pantoprazole (PROTONIX) EC tablet 40 mg  40 mg Oral Daily Poggi, Marshall Cork, MD      . polyethylene glycol 0.4% and propylene glycol 0.3% (SYSTANE) ophthalmic gel   Both Eyes PRN Poggi, Marshall Cork, MD      . ropivacaine (PF) 5 mg/mL (0.5%) (NAROPIN) 5 MG/ML injection           . sodium phosphate (FLEET) 7-19 GM/118ML enema 1 enema  1 enema Rectal Once PRN Poggi, Marshall Cork, MD      . Derrill Memo ON 01/14/2017] Vitamin D (Ergocalciferol) (DRISDOL) capsule 50,000 Units  50,000 Units Oral Q Mon Poggi, Marshall Cork, MD         Discharge Medications: Please see discharge summary for a list of discharge medications.  Relevant Imaging Results:  Relevant Lab Results:   Additional Information  (SSN: [id number])  Smith Mince, Student-Social Work

## 2017-01-10 NOTE — Anesthesia Procedure Notes (Signed)
Procedure Name: Intubation Performed by: Lance Muss Pre-anesthesia Checklist: Patient identified, Patient being monitored, Timeout performed, Emergency Drugs available and Suction available Patient Re-evaluated:Patient Re-evaluated prior to induction Oxygen Delivery Method: Circle system utilized Preoxygenation: Pre-oxygenation with 100% oxygen Induction Type: IV induction Ventilation: Mask ventilation without difficulty Laryngoscope Size: Mac and 3 Grade View: Grade III Tube type: Oral Tube size: 7.0 mm Number of attempts: 1 Airway Equipment and Method: Stylet and LTA kit utilized Placement Confirmation: ETT inserted through vocal cords under direct vision,  positive ETCO2 and breath sounds checked- equal and bilateral Secured at: 22 cm Tube secured with: Tape Dental Injury: Teeth and Oropharynx as per pre-operative assessment  Difficulty Due To: Difficult Airway- due to anterior larynx Future Recommendations: Recommend- induction with short-acting agent, and alternative techniques readily available

## 2017-01-10 NOTE — Op Note (Signed)
01/10/2017  10:32 AM  Patient:   Yolanda Taylor  Pre-Op Diagnosis:   Degenerative joint disease, right shoulder.  Post-Op Diagnosis:   Same with rotator cuff tear, right shoulder.  Procedure:   Right total shoulder arthroplasty and open repair of supraspinatus tendon, right shoulder.  Surgeon:   Pascal Lux, MD  Assistant:   Cameron Proud, PA-C  Anesthesia:   General endotracheal intubation with an interscalene block.  Findings:   As above. The rotator cuff demonstrated a small near-full thickness tear involving the mid-insertional fibers of the supraspinatus tendon, which was not recognized until the end of the case..  Complications:   None  EBL:  100 cc  Fluids:   600 cc crystalloid  UOP:   None  TT:   None  Drains:   None  Closure:   Staples  Implants:   Biomet Comprehensive system with a pressfit #11 micro-humeral stem, a 42 x 18 mm humeral head, and a small cemented glenoid component with a Regenerex post. Biomet JuggerKnot anchor x1.  Brief Clinical Note:   The patient is an 83 year old female with a long history of gradually worsening right shoulder pain. Her symptoms have progressed despite medications, activity modification, etc. Her history and examination were consistent with degenerative joint disease confirmed by plain radiographs. A CT scan suggested that the rotator cuff was intact. The patient presents at this time for a right total shoulder arthroplasty.  Procedure:   The patient underwent placement of an interscalene block in the preoperative holding area by the anesthesiologist before she was brought into the operating room and lain in the supine position. After satisfactory general endotracheal intubation and anesthesia was achieved, the patient was repositioned in the beach chair position using the beach chair positioner. The right shoulder and upper extremity were prepped with ChloraPrep solution before being draped sterilely. Preoperative  antibiotics were administered. A standard anterior approach to the shoulder was made through an approximately 4-5 inch incision. The incision was carried down through the subcutaneous tissues to expose the deltopectoral fascia. The interval between the deltoid and pectoralis muscles was identified and this plane developed, retracting the cephalic vein laterally with the deltoid muscle. While mobilizing the cephalic vein, it tore, so it was ligated using 2-0 vicryl sutures.The conjoined tendon was identified. The lateral margin was dissected and the Kolbel self-retraining retractor inserted. The "three sisters" were identified and cauterized. Bursal tissues were removed to improve visualization. The biceps tendon was identified in the bicipital groove, and a soft tissue biceps tenodesis performed using 2 #0 Ethibond interrupted sutures, tacking the distal portion of the biceps tendon to the adjacent pectoralis major tendon tissues. The subscapularis tendon was released from its attachment to the lesser tuberosity 1 cm proximal to its insertion and several tagging sutures placed. The inferior capsule was released with care after identifying and protecting the axillary nerve. The proximal humeral cut was made at approximately 30 of retroversion using the extra-medullary guide.   Attention was directed to the glenoid. The labrum was debrided circumferentially before the center of the glenoid was marked with electrocautery. The small and medium sizers were positioned and it was elected to proceed with a small glenoid component. The guidewire was drilled into the glenoid neck using the appropriate guide. After verifying its position, the glenoid was lightly reamed with the butterfly reamer before the centralizing Regenerex post reamer was used. The small peripheral peg guide was positioned and each of the three pegs drilled sequentially, leaving  the preceding drill bit in place so as to minimize shifting of the guide.  The bony surfaces were prepared for cementing by irrigating them thoroughly with bacitracin saline solution using the jet lavage system, then packing the glenoid with a Neo-Synephrine soaked sponge. Meanwhile, cement was mixed on the back table. When it was ready, some cement was injected into each of the three peg holes using a Toomey syringe and additional cement applied to the posterior aspect of the glenoid component. The component was impacted into place and the excess cement was removed using a Surveyor, quantity. Pressure was maintained on the glenoid until the cement hardened.  Attention was directed to the humeral side. The humeral canal was reamed sequentially beginning with the end-cutting reamer then progressing from a 4 mm reamer up to an 11 mm reamer. This provided excellent circumferential chatter. The canal was broached beginning with a #8 micro broach and progressing to a #11 broach. This was left in place and a trial reduction performed using the 42 x 18 mm trial humeral head. The arm demonstrated excellent range of motion as the hand could be brought across the chest to the opposite shoulder and brought to the top of the patient's head and to the patient's ear. The shoulder remained stable throughout this range of motion, and was stable with abduction and external rotation. The joint was dislocated and the trial components removed. The permanent #11 micro-stem was impacted into place with care taken to maintain the appropriate version. The permanent 42 x 18 mm humeral head with the standard taper set in the "A" position was put together on the back table and impacted into place. Again, the [facility] taper locking mechanism was verified using manual distraction. The shoulder was relocated and again placed through a range of motion with the findings as described above.  The wound was copiously irrigated with bacitracin saline solution using the jet lavage system before a total of 20 cc of Exparel  diluted out to 60 cc with normal saline and 30 cc of 0.5% Sensorcaine with epinephrine was injected into the pericapsular and peri-incisional tissues to help with postoperative analgesia. The subscapularis tendon was reapproximated using #2 FiberWire interrupted sutures.   Upon completion of the subscapularis tendon repair, a small degenerative tear in the mid-insertional fibers of the supraspinatus tendon measuring about 1 x 1.5 cm was identified and repaired using a single Biomet JuggerKnot anchor. Both sets of sutures were brought back laterally through bone tunnels to effect a double row closure equivalent. An apparent water-tight closure was obtained.   The wound again was copiously irrigated with bacitracin saline solution using the jet lavage system before the deltopectoral interval was closed using #0 Vicryl interrupted sutures before the subcutaneous tissues were closed using 2-0 Vicryl interrupted sutures. The skin was closed using staples. Prior to closing the skin, 1 g of transexemic acid in 10 cc of normal saline was injected intra-articularly to help with postoperative bleeding. A sterile occlusive dressing was applied to the wound before the arm was placed into a shoulder immobilizer with an abduction pillow. A polar care device also was applied to the shoulder. The patient was then transferred back to a hospital bed before being awakened, extubated, and returned to the recovery room in satisfactory condition after tolerating the procedure well.

## 2017-01-10 NOTE — Anesthesia Post-op Follow-up Note (Signed)
Anesthesia QCDR form completed.        

## 2017-01-11 ENCOUNTER — Encounter
Admission: RE | Admit: 2017-01-11 | Discharge: 2017-01-11 | Disposition: A | Discharge status: Home / Self Care | Payer: Medicare Other | Source: Ambulatory Visit | Attending: Internal Medicine | Admitting: Internal Medicine

## 2017-01-11 ENCOUNTER — Encounter: Payer: Self-pay | Admitting: Surgery

## 2017-01-11 LAB — CBC WITH DIFFERENTIAL/PLATELET
Basophils Absolute: 0 10*3/uL (ref 0–0.1)
Basophils Relative: 0 %
EOS PCT: 0 %
Eosinophils Absolute: 0 10*3/uL (ref 0–0.7)
HEMATOCRIT: 36.3 % (ref 35.0–47.0)
Hemoglobin: 12.1 g/dL (ref 12.0–16.0)
LYMPHS PCT: 8 %
Lymphs Abs: 1.2 10*3/uL (ref 1.0–3.6)
MCH: 30.5 pg (ref 26.0–34.0)
MCHC: 33.2 g/dL (ref 32.0–36.0)
MCV: 91.8 fL (ref 80.0–100.0)
MONO ABS: 1.6 10*3/uL — AB (ref 0.2–0.9)
MONOS PCT: 11 %
NEUTROS ABS: 11.4 10*3/uL — AB (ref 1.4–6.5)
Neutrophils Relative %: 81 %
PLATELETS: 200 10*3/uL (ref 150–440)
RBC: 3.96 MIL/uL (ref 3.80–5.20)
RDW: 13 % (ref 11.5–14.5)
WBC: 14.2 10*3/uL — ABNORMAL HIGH (ref 3.6–11.0)

## 2017-01-11 LAB — BASIC METABOLIC PANEL
Anion gap: 8 (ref 5–15)
BUN: 15 mg/dL (ref 6–20)
CALCIUM: 8.4 mg/dL — AB (ref 8.9–10.3)
CO2: 28 mmol/L (ref 22–32)
CREATININE: 0.82 mg/dL (ref 0.44–1.00)
Chloride: 100 mmol/L — ABNORMAL LOW (ref 101–111)
GFR calc Af Amer: >60 mL/min (ref >60)
GFR calc non Af Amer: >60 mL/min (ref >60)
Glucose, Bld: 134 mg/dL — ABNORMAL HIGH (ref 65–99)
Potassium: 3.8 mmol/L (ref 3.5–5.1)
Sodium: 136 mmol/L (ref 135–145)

## 2017-01-11 MED ORDER — ALPRAZOLAM 0.25 MG PO TABS
0.2500 mg | ORAL_TABLET | Freq: Three times a day (TID) | ORAL | Status: DC | PRN
  Administered 2017-01-11: 0.25 mg via ORAL
  Filled 2017-01-11: qty 1

## 2017-01-11 MED ORDER — ENOXAPARIN SODIUM 40 MG/0.4ML ~~LOC~~ SOLN: 40.0000 mg | SUBCUTANEOUS | 0 refills | Status: AC

## 2017-01-11 MED ORDER — OXYCODONE HCL 10 MG PO TABS: 10.0000 mg | ORAL_TABLET | ORAL | 0 refills | Status: AC | PRN

## 2017-01-11 MED ORDER — TRAMADOL HCL 50 MG PO TABS: 50.0000 mg | ORAL_TABLET | ORAL | 0 refills | Status: DC | PRN

## 2017-01-11 MED ORDER — ZOLPIDEM TARTRATE 5 MG PO TABS
5.0000 mg | ORAL_TABLET | Freq: Once | ORAL | Status: AC
  Administered 2017-01-11: 5 mg via ORAL
  Filled 2017-01-11 (×2): qty 1

## 2017-01-11 NOTE — Progress Notes (Signed)
  Subjective: 1 Day Post-Op Procedure(s) (LRB): TOTAL SHOULDER ARTHROPLASTY (Right) ROTATOR CUFF REPAIR SHOULDER OPEN (Right) Patient reports pain as moderate.   Patient seen in rounds with Dr. Roland Rack. Patient is well, but has had some minor complaints of anxiety Plan is to go Rehab after hospital stay. Negative for chest pain and shortness of breath Fever: no Gastrointestinal: Negative for nausea and vomiting  Objective: Vital signs in last 24 hours: Temp:  [97.5 F (36.4 C)-99.4 F (37.4 C)] 99.4 F (37.4 C) (10/26 0345) Pulse Rate:  [69-99] 99 (10/26 0345) Resp:  [12-22] 12 (10/26 0345) BP: (103-138)/(42-94) 127/63 (10/26 0345) SpO2:  [92 %-100 %] 93 % (10/26 0345) Weight:  [67.6 kg (149 lb)] 67.6 kg (149 lb) (10/25 1358)  Intake/Output from previous day:  Intake/Output Summary (Last 24 hours) at 01/11/17 0717 Last data filed at 01/10/17 1340  Gross per 24 hour  Intake             1000 ml  Output              100 ml  Net              900 ml    Intake/Output this shift: No intake/output data recorded.  Labs: No results for input(s): HGB in the last 72 hours. No results for input(s): WBC, RBC, HCT, PLT in the last 72 hours. No results for input(s): NA, K, CL, CO2, BUN, CREATININE, GLUCOSE, CALCIUM in the last 72 hours. No results for input(s): LABPT, INR in the last 72 hours.   EXAM General - Patient is Alert and Oriented Extremity - Neurovascular intact Sensation intact distally Dressing/Incision - clean, dry, no drainage Motor Function - intact, moving fingers well on exam.   Past Medical History:  Diagnosis Date  . Arthritis   . Breast cancer ([place]) 2006   right breast, radiation  . Cancer ([place])   . COPD (chronic obstructive pulmonary disease) ([place])   . Emphysema lung ([place])   . GERD (gastroesophageal reflux disease)   . History of hiatal hernia   . History of kidney stones   . Hypertension   . Pre-diabetes   . TIA (transient ischemic attack)      Assessment/Plan: 1 Day Post-Op Procedure(s) (LRB): TOTAL SHOULDER ARTHROPLASTY (Right) ROTATOR CUFF REPAIR SHOULDER OPEN (Right) Active Problems:   Status post total shoulder arthroplasty, right  Estimated body mass index is 28.15 kg/m as calculated from the following:   Height as of this encounter: 5\' 1"  (1.549 m).   Weight as of this encounter: 67.6 kg (149 lb). Advance diet Up with therapy D/C IV fluids Discharge to SNF when works with physical therapy Possible today or tomorrow.  DVT Prophylaxis - Lovenox and TED hose Weight-Bearing as tolerated  Reche Dixon, PA-C Orthopaedic Surgery 01/11/2017, 7:17 AM

## 2017-01-11 NOTE — Progress Notes (Signed)
Called report to [place], LPN at [facility]. Answered all questions. Packet given to daughter with narcotic Rxs given

## 2017-01-11 NOTE — Evaluation (Signed)
Occupational Therapy Evaluation Patient Details Name: Yolanda Taylor MRN: [id number] DOB: 1930/12/14 Today's Date: 01/11/2017    History of Present Illness Pt. is an 83 y.o. female who was admitted to [facility] for a right total shoulder replacement. Pt. PMHx includes: Arthritis, Breast CA, COPD, Emphysema Lung, GERD, kidney stones.    Clinical Impression   Pt. Is an 83 y.o. Female who was admitted to [facility] for a Right Total shoulder replacement. Pt. was very anxious upon arrival about the polar care, being cold, and wanting to return to bed. Pt. was assisted, and required extensive cues for for redirection. Pt. presents with 8/10 pain, and dominant RUE immobilization which hinder his ability to complete ADL, and IADL tasks. Pt. Was previously residing home alone, and was independent with ADLs, IADLs, and medication management. Pt. Will benefit from continued skilled OT services for ADL training, and A/E training to improve ADL, and IADL functioning safely. Pt. would like to go to SNF upon discharge. OT would benefit from follow-up OT services upon discharge to maximize independence with ADLs, and IADLs.        Follow Up Recommendations  SNF    Equipment Recommendations       Recommendations for Other Services       Precautions / Restrictions Precautions Precautions: Shoulder Type of Shoulder Precautions: total shoulder Shoulder Interventions: Shoulder sling/immobilizer Restrictions Weight Bearing Restrictions: Yes RUE Weight Bearing: Non weight bearing                                                    ADL either performed or assessed with clinical judgement   ADL Overall ADL's : Needs assistance/impaired Eating/Feeding: Set up   Grooming: Minimal assistance   Upper Body Bathing: Maximal assistance   Lower Body Bathing: Moderate assistance   Upper Body Dressing : Maximal assistance   Lower Body Dressing: Moderate assistance                Functional mobility during ADLs: Supervision/safety General ADL Comments: Pt. requires verbal cues for redirection.     Vision Patient Visual Report: No change from baseline       Perception     Praxis      Pertinent Vitals/Pain Pain Assessment: 0-10 Pain Score: 8  Pain Location: Right shoulder Pain Descriptors / Indicators: Aching Pain Intervention(s): Limited activity within patient's tolerance     Hand Dominance     Extremity/Trunk Assessment Upper Extremity Assessment Upper Extremity Assessment: RUE deficits/detail RUE: Unable to fully assess due to immobilization           Communication Communication Communication: HOH   Cognition Arousal/Alertness: Awake/alert Behavior During Therapy: Anxious Overall Cognitive Status: Difficult to assess                                     General Comments       Exercises     Shoulder Instructions      Home Living Family/patient expects to be discharged to:: Skilled nursing facility Living Arrangements: Alone Available Help at Discharge: Family   Home Access: Stairs to enter Technical brewer of Steps: 3 Entrance Stairs-Rails: None Home Layout: One level     Bathroom Shower/Tub: Tub/shower unit         Home Equipment:  Cane - single point          Prior Functioning/Environment Level of Independence: Independent with assistive device(s)        Comments: Independent with ADLs, IADLs, aand medication management.        OT Problem List: Decreased strength;Decreased range of motion;Decreased activity tolerance;Decreased knowledge of use of DME or AE;Impaired UE functional use;Decreased safety awareness;Pain      OT Treatment/Interventions: Self-care/ADL training;Therapeutic exercise;DME and/or AE instruction;Therapeutic activities;Patient/family education;Energy conservation    OT Goals(Current goals can be found in the care plan section) Acute Rehab OT Goals Patient Stated  Goal: To go to rehab OT Goal Formulation: With patient Potential to Achieve Goals: Good  OT Frequency: Min 1X/week   Barriers to D/C:            Co-evaluation              AM-PAC PT "6 Clicks" Daily Activity     Outcome Measure Help from another person eating meals?: A Little Help from another person taking care of personal grooming?: A Little Help from another person toileting, which includes using toliet, bedpan, or urinal?: A Lot Help from another person bathing (including washing, rinsing, drying)?: A Lot Help from another person to put on and taking off regular upper body clothing?: A Lot Help from another person to put on and taking off regular lower body clothing?: A Lot 6 Click Score: 14   End of Session    Activity Tolerance: Patient tolerated treatment well Patient left: in bed;in chair;with call bell/phone within reach  OT Visit Diagnosis: Muscle weakness (generalized) (M62.81)                Time: 8469-6295 OT Time Calculation (min): 17 min Charges:  OT General Charges $OT Visit: 1 Visit OT Evaluation $OT Eval Moderate Complexity: 1 Mod G-Codes: OT G-codes **NOT FOR INPATIENT CLASS** Functional Limitation: Self care Self Care Current Status (M8413): At least 40 percent but less than 60 percent impaired, limited or restricted Self Care Goal Status (K4401): At least 1 percent but less than 20 percent impaired, limited or restricted   Harrel Carina, MS, OTR/L   Harrel Carina, MS, OTR/L 01/11/2017, 11:42 AM

## 2017-01-11 NOTE — Clinical Social Work Note (Signed)
Clinical Social Work Assessment  Patient Details  Name: Yolanda Taylor MRN: [id number] Date of Birth: 08/12/1930  Date of referral:  01/11/17               Reason for consult:  Facility Placement                Permission sought to share information with:  Chartered certified accountant granted to share information::  Yes, Verbal Permission Granted  Name::      Yolanda Taylor::   [facility]   Relationship::     Contact Information:     Housing/Transportation Living arrangements for the past 2 months:  Yolanda Taylor of Information:  Patient, Adult Children Patient Interpreter Needed:  None Criminal Activity/Legal Involvement Pertinent to Current Situation/Hospitalization:  No - Comment as needed Significant Relationships:  Adult Children Lives with:  Self Do you feel safe going back to the place where you live?  Yes Need for family participation in patient care:  Yes (Comment)  Care giving concerns:  Patient lives in [place] alone.     Social Worker assessment / plan:  Holiday representative (CSW) received SNF consult. PT is recommending SNF. Patient is stable for D/C today. CSW met with patient and her daughter Yolanda Taylor at bedside to discuss D/C plan. Per patient she lives alone and is agreeable to SNF search in [place]. Patient prefers [facility] FL2 complete and faxed out.   CSW presented bed offers and patient chose [facility] Patient is medically stable for D/C to [facility] today. Per [facility] admissions coordinator at [facility] patient can come today to room 201-B. RN will call report at [phone] and her daughter Yolanda Taylor will transport. Please reconsult if future social work needs rise. CSW signing off.   Employment status:  Retired Nurse, adult PT Recommendations:  [place] / Referral to community resources:  [place]  Patient/Family's Response to care:  Patient and her daughter are agreeable for patient to go to [facility] today.   Patient/Family's Understanding of and Emotional Response to Diagnosis, Current Treatment, and Prognosis:  Patient and her daughter were very pleasant and thanked CSW for assistance.   Emotional Assessment Appearance:  Appears stated age Attitude/Demeanor/Rapport:    Affect (typically observed):  Accepting, Adaptable, Pleasant Orientation:  Oriented to Self, Oriented to Place, Oriented to  Time, Oriented to Situation Alcohol / Substance use:  Not Applicable Psych involvement (Current and /or in the community):  No (Comment)  Discharge Needs  Concerns to be addressed:  Discharge Planning Concerns Readmission within the last 30 days:  No Current discharge risk:  None Barriers to Discharge:  No Barriers Identified   Yolanda Taylor, Yolanda Beets, LCSW 01/11/2017, 2:42 PM

## 2017-01-11 NOTE — Progress Notes (Signed)
Physical Therapy Treatment Patient Details Name: Yolanda Taylor MRN: [id number] DOB: 1931/01/19 Today's Date: 01/11/2017    History of Present Illness Pt. is an 83 y.o. female who was admitted to [facility] for a right total shoulder replacement. Pt. PMHx includes: Arthritis, Breast CA, COPD, Emphysema Lung, GERD, kidney stones.     PT Comments    Pt still very anxious and hyper-reactive to anything that does not fit her pre-conceived paradigm of how post-op planning and rehab goes.  She did not become tearful today, but still required excessive redirection and cosseting to stay calm and on task.  Pt reports she is willing to take a "nerve pill" and nursing notified of this after session. Overall pt did relatively well concerning mobility and safety with limited in-home distances but is so easily worked up that the prospect of going home is all but petrifying for this patient.  Given her limited ability to work through this and push herself she would likely be safer and better served with a stint in rehab to build her confidence and in general to improve her safety acumen.    Follow Up Recommendations  DC plan and follow up therapy as arranged by surgeon;Other (comment) (pt c/o to be terrified about the idea of anything but STR)     Equipment Recommendations       Recommendations for Other Services       Precautions / Restrictions Precautions Precautions: Shoulder;Fall Type of Shoulder Precautions: total shoulder Shoulder Interventions: Shoulder sling/immobilizer Restrictions Weight Bearing Restrictions: Yes RUE Weight Bearing: Non weight bearing    Mobility  Bed Mobility Overal bed mobility: Modified Independent             General bed mobility comments: Pt did try to use R UE (in immob sling) to get to sitting EOB to her right.  Needed cuing to insure she only used L UE and protected R arm  Transfers Overall transfer level: Modified independent Equipment used: Straight  cane             General transfer comment: Pt again able to rise to standing w/o direct assist, she c/o dizziness t/o the entire time standing.   Ambulation/Gait Ambulation/Gait assistance: Min assist Ambulation Distance (Feet): 100 Feet Assistive device: Straight cane       General Gait Details: With any upright/ambulation activity pt becomes anxious and at times impulsive.  Able to keep her more focused on safety and staying calm today, but pt began to feel nauseated and needed to return to her room.   Stairs            Wheelchair Mobility    Modified Rankin (Stroke Patients Only)       Balance Overall balance assessment: Modified Independent                                          Cognition Arousal/Alertness: Awake/alert Behavior During Therapy: Anxious Overall Cognitive Status: Within Functional Limits for tasks assessed                                 General Comments: Pt still anxious and quick to get worked up, but nothing like yesterday's exam.        Exercises General Exercises - Upper Extremity Elbow Flexion: AROM;10 reps;Right Elbow Extension: AROM;10 reps;Right Wrist Flexion:  Strengthening;AROM;10 reps;Right Wrist Extension: AROM;Strengthening;10 reps;Right General Exercises - Lower Extremity Ankle Circles/Pumps: AROM;10 reps Long Arc Quad: Strengthening;10 reps Heel Slides: Strengthening;10 reps Hip ABduction/ADduction: Strengthening;10 reps Hip Flexion/Marching: Strengthening;10 reps    General Comments        Pertinent Vitals/Pain Pain Assessment: 0-10 Pain Score: 6  Pain Location: R shoulder Pain Descriptors / Indicators: Aching Pain Intervention(s): Limited activity within patient's tolerance    Home Living Family/patient expects to be discharged to:: Skilled nursing facility Living Arrangements: Alone Available Help at Discharge: Family   Home Access: Stairs to enter Entrance Stairs-Rails:  None Home Layout: One level Home Equipment: Cane - single point      Prior Function Level of Independence: Independent with assistive device(s)      Comments: Independent with ADLs, IADLs, aand medication management.   PT Goals (current goals can now be found in the care plan section) Acute Rehab PT Goals Patient Stated Goal: To go to rehab Progress towards PT goals: Progressing toward goals    Frequency    BID      PT Plan Current plan remains appropriate    Co-evaluation              AM-PAC PT "6 Clicks" Daily Activity  Outcome Measure  Difficulty turning over in bed (including adjusting bedclothes, sheets and blankets)?: A Little Difficulty moving from lying on back to sitting on the side of the bed? : A Little Difficulty sitting down on and standing up from a chair with arms (e.g., wheelchair, bedside commode, etc,.)?: A Little Help needed moving to and from a bed to chair (including a wheelchair)?: A Little Help needed walking in hospital room?: A Little Help needed climbing 3-5 steps with a railing? : A Lot 6 Click Score: 17    End of Session Equipment Utilized During Treatment: Gait belt Activity Tolerance:  (limited due to anxiety, feeling dizzy/nauseated) Patient left: with call bell/phone within reach;with chair alarm set Nurse Communication: Mobility status PT Visit Diagnosis: Muscle weakness (generalized) (M62.81);Dizziness and giddiness (R42)     Time: 4196-2229 PT Time Calculation (min) (ACUTE ONLY): 14 min  Charges:  $Therapeutic Exercise: 8-22 mins                    G Codes:       Kreg Shropshire, DPT 01/11/2017, 12:12 PM

## 2017-01-11 NOTE — Discharge Instructions (Signed)
INSTRUCTIONS AFTER Surgery  o Remove items at home which could result in a fall. This includes throw rugs or furniture in walking pathways o ICE to the affected joint every three hours while awake for 30 minutes at a time, for at least the first 3-5 days, and then as needed for pain and swelling.  Continue to use ice for pain and swelling. You may notice swelling that will progress down to the foot and ankle.  This is normal after surgery.  Elevate your leg when you are not up walking on it.   o Continue to use the breathing machine you got in the hospital (incentive spirometer) which will help keep your temperature down.  It is common for your temperature to cycle up and down following surgery, especially at night when you are not up moving around and exerting yourself.  The breathing machine keeps your lungs expanded and your temperature down.   DIET:  As you were doing prior to hospitalization, we recommend a well-balanced diet.  DRESSING / WOUND CARE / SHOWERING  Keep the surgical dressing until follow up.  The dressing is water proof, so you can shower without any extra covering.  IF THE DRESSING FALLS OFF or the wound gets wet inside, change the dressing with sterile gauze.  Please use good hand washing techniques before changing the dressing.  Do not use any lotions or creams on the incision until instructed by your surgeon.    ACTIVITY  o Increase activity slowly as tolerated, but follow the weight bearing instructions below.   o No driving for 6 weeks or until further direction given by your physician.  You cannot drive while taking narcotics.  o No lifting or carrying greater than 10 lbs. until further directed by your surgeon. o Avoid periods of inactivity such as sitting longer than an hour when not asleep. This helps prevent blood clots.  o You may return to work once you are authorized by your doctor.     WEIGHT BEARING   Weight bearing as tolerated with assist device (walker,  cane, etc) as directed, use it as long as suggested by your surgeon or therapist, typically at least 4-6 weeks.   EXERCISES  Results after joint surgery are often greatly improved when you follow the exercise, range of motion and muscle strengthening exercises prescribed by your doctor. Safety measures are also important to protect the joint from further injury. Any time any of these exercises cause you to have increased pain or swelling, decrease what you are doing until you are comfortable again and then slowly increase them. If you have problems or questions, call your caregiver or physical therapist for advice.   Rehabilitation is important following a joint surgery. After just a few days of immobilization, the muscles of the leg can become weakened and shrink (atrophy).  These exercises are designed to build up the tone and strength of the thigh and leg muscles and to improve motion. Often times heat used for twenty to thirty minutes before working out will loosen up your tissues and help with improving the range of motion but do not use heat for the first two weeks following surgery (sometimes heat can increase post-operative swelling).   These exercises can be done on a training (exercise) mat, on the floor, on a table or on a bed. Use whatever works the best and is most comfortable for you.    Use music or television while you are exercising so that the exercises are  a pleasant break in your day. This will make your life better with the exercises acting as a break in your routine that you can look forward to.   Perform all exercises about fifteen times, three times per day or as directed.  You should exercise both the operative leg and the other leg as well.  Exercises include:    Quad Sets - Tighten up the muscle on the front of the thigh (Quad) and hold for 5-10 seconds.    Straight Leg Raises - With your knee straight (if you were given a brace, keep it on), lift the leg to 60 degrees,  hold for 3 seconds, and slowly lower the leg.  Perform this exercise against resistance later as your leg gets stronger.   Leg Slides: Lying on your back, slowly slide your foot toward your buttocks, bending your knee up off the floor (only go as far as is comfortable). Then slowly slide your foot back down until your leg is flat on the floor again.   Angel Wings: Lying on your back spread your legs to the side as far apart as you can without causing discomfort.   Hamstring Strength:  Lying on your back, push your heel against the floor with your leg straight by tightening up the muscles of your buttocks.  Repeat, but this time bend your knee to a comfortable angle, and push your heel against the floor.  You may put a pillow under the heel to make it more comfortable if necessary.   A rehabilitation program following joint surgery can speed recovery and prevent re-injury in the future due to weakened muscles. Contact your doctor or a physical therapist for more information on knee rehabilitation.    CONSTIPATION  Constipation is defined medically as fewer than three stools per week and severe constipation as less than one stool per week.  Even if you have a regular bowel pattern at home, your normal regimen is likely to be disrupted due to multiple reasons following surgery.  Combination of anesthesia, postoperative narcotics, change in appetite and fluid intake all can affect your bowels.   YOU MUST use at least one of the following options; they are listed in order of increasing strength to get the job done.  They are all available over the counter, and you may need to use some, POSSIBLY even all of these options:    Drink plenty of fluids (prune juice may be helpful) and high fiber foods Colace 100 mg by mouth twice a day  Senokot for constipation as directed and as needed Dulcolax (bisacodyl), take with full glass of water  Miralax (polyethylene glycol) once or twice a day as needed.  If  you have tried all these things and are unable to have a bowel movement in the first 3-4 days after surgery call either your surgeon or your primary doctor.    If you experience loose stools or diarrhea, hold the medications until you stool forms back up.  If your symptoms do not get better within 1 week or if they get worse, check with your doctor.  If you experience "the worst abdominal pain ever" or develop nausea or vomiting, please contact the office immediately for further recommendations for treatment.   ITCHING:  If you experience itching with your medications, try taking only a single pain pill, or even half a pain pill at a time.  You can also use Benadryl over the counter for itching or also to help with sleep.  TED HOSE STOCKINGS:  Use stockings on both legs until for at least 2 weeks or as directed by physician office. They may be removed at night for sleeping.  MEDICATIONS:  See your medication summary on the After Visit Summary that nursing will review with you.  You may have some home medications which will be placed on hold until you complete the course of blood thinner medication.  It is important for you to complete the blood thinner medication as prescribed.  PRECAUTIONS:  If you experience chest pain or shortness of breath - call 911 immediately for transfer to the hospital emergency department.   If you develop a fever greater that 101 F, purulent drainage from wound, increased redness or drainage from wound, foul odor from the wound/dressing, or calf pain - CONTACT YOUR SURGEON.                                                   FOLLOW-UP APPOINTMENTS:  If you do not already have a post-op appointment, please call the office for an appointment to be seen by your surgeon.  Guidelines for how soon to be seen are listed in your After Visit Summary, but are typically between 1-4 weeks after surgery.  OTHER INSTRUCTIONS:   Keep the shoulder immobilizer on for activities. Able  to unwrap the strap around the neck with resting or at nighttime. Physical therapy will work on gentle elbow and wrist range of motion as well as passive shoulder range of motion.  MAKE SURE YOU:   Understand these instructions.   Get help right away if you are not doing well or get worse.    Thank you for letting us be a part of your medical care team.  It is a privilege we respect greatly.  We hope these instructions will help you stay on track for a fast and full recovery!

## 2017-01-11 NOTE — Progress Notes (Signed)
Patient called out complaining of not being able to breath. VSS. MD notified. Orders received for ambien 5mg  at bedtime

## 2017-01-11 NOTE — Clinical Social Work Placement (Signed)
   CLINICAL SOCIAL WORK PLACEMENT  NOTE  Date:  01/11/2017  Patient Details  Name: Yolanda Taylor MRN: [id number] Date of Birth: 06/29/1930  Clinical Social Work is seeking post-discharge placement for this patient at the [place] level of care (*CSW will initial, date and re-position this form in  chart as items are completed):  Yes   Patient/family provided with [place] Work Department's list of facilities offering this level of care within the geographic area requested by the patient (or if unable, by the patient's family).  Yes   Patient/family informed of their freedom to choose among providers that offer the needed level of care, that participate in Medicare, Medicaid or managed care program needed by the patient, have an available bed and are willing to accept the patient.  Yes   Patient/family informed of [facility]'s ownership interest in [facility] and [facility], as well as of the fact that they are under no obligation to receive care at these facilities.  PASRR submitted to EDS on       PASRR number received on       Existing PASRR number confirmed on 01/10/17     FL2 transmitted to all facilities in geographic area requested by pt/family on 01/11/17     FL2 transmitted to all facilities within larger geographic area on       Patient informed that his/her managed care company has contracts with or will negotiate with certain facilities, including the following:        Yes   Patient/family informed of bed offers received.  Patient chooses bed at  [facility] )     Physician recommends and patient chooses bed at      Patient to be transferred to  [facility] ) on 01/11/17.  Patient to be transferred to facility by  (Patient's daughter [name] will transport patient. )     Patient family notified on 01/11/17 of transfer.  Name of family member notified:   (Patient's daughter [name] is at bedside and aware of  D/C today. )     PHYSICIAN       Additional Comment:    _______________________________________________ Anuradha Chabot, Veronia Beets, LCSW 01/11/2017, 2:41 PM

## 2017-01-11 NOTE — Discharge Summary (Signed)
Physician Discharge Summary  Patient ID: Yolanda Taylor MRN: [id number] DOB/AGE: April 06, 1930 83 y.o.  Admit date: 01/10/2017 Discharge date: 01/11/2017  Admission Diagnoses:  PRIMARY OSTEOARTHRITIS OF RIGHT SHOULDER   Discharge Diagnoses: Patient Active Problem List   Diagnosis Date Noted  . Status post total shoulder arthroplasty, right 01/10/2017  . Femoral condyle fracture ([place]) 10/11/2014    Past Medical History:  Diagnosis Date  . Arthritis   . Breast cancer ([place]) 2006   right breast, radiation  . Cancer ([place])   . COPD (chronic obstructive pulmonary disease) ([place])   . Emphysema lung ([place])   . GERD (gastroesophageal reflux disease)   . History of hiatal hernia   . History of kidney stones   . Hypertension   . Pre-diabetes   . TIA (transient ischemic attack)      Transfusion: none   Consultants (if any): Treatment Team:  Leandrew Koyanagi, MD  Discharged Condition: Improved  Hospital Course: Yolanda Taylor is an 83 y.o. female who was admitted 01/10/2017 with a diagnosis of degenerative arthrosis to right shoulder and went to the operating room on 01/10/2017 and underwent the above named procedures.    Surgeries:Procedure(s): TOTAL SHOULDER ARTHROPLASTY ROTATOR CUFF REPAIR SHOULDER OPEN on 01/10/2017  Pre-Op Diagnosis:   Degenerative joint disease, right shoulder.  Post-Op Diagnosis:   Same with rotator cuff tear, right shoulder.  Procedure:   Right total shoulder arthroplasty and open repair of supraspinatus tendon, right shoulder.  Surgeon:   Pascal Lux, MD  Assistant:   Cameron Proud, PA-C  Anesthesia:   General endotracheal intubation with an interscalene block.  Findings:   As above. The rotator cuff demonstrated a small near-full thickness tear involving the mid-insertional fibers of the supraspinatus tendon, which was not recognized until the end of the case..  Complications:   None  EBL:  100 cc  Fluids:   600 cc  crystalloid  UOP:   None  TT:   None  Drains:   None  Closure:   Staples  Implants:   Biomet Comprehensive system with a pressfit #11 micro-humeral stem, a 42 x 18 mm humeral head, and a small cemented glenoid component with a Regenerex post. Biomet JuggerKnot anchor x1.  Brief Clinical Note:   The patient is an 83 year old female with a long history of gradually worsening right shoulder pain. Her symptoms have progressed despite medications, activity modification, etc. Her history and examination were consistent with degenerative joint disease confirmed by plain radiographs. A CT scan suggested that the rotator cuff was intact. The patient presents at this time for a right total shoulder arthroplasty. Patient tolerated the surgery well. No complications .Patient was taken to PACU where she was stabilized and then transferred to the orthopedic floor.  Patient started on Lovenox 40mg  q 24 hrs. Foot pumps applied bilaterally at 80 mm hgb. Heels elevated off bed with rolled towels. No evidence of DVT. Calves non tender. Negative Homan. Physical therapy started on day #1 for gait training and transfer with OT starting on  day #1 for ADL and assisted devices. Patient has done well with therapy. Ambulated 100 feet upon being discharged.  Patient's IV And Foley were discontinued on day #1 with Hemovac being discontinued on day #2. Dressing was changed on day 2 prior to patient being discharged   She was given perioperative antibiotics:  Anti-infectives    Start     Dose/Rate Route Frequency Ordered Stop   01/10/17 1415  ceFAZolin (ANCEF) 2  g in dextrose 5 % 100 mL IVPB     2 g 200 mL/hr over 30 Minutes Intravenous Every 6 hours 01/10/17 1408 01/11/17 0442   01/10/17 1400  ceFAZolin (ANCEF) IVPB 2g/100 mL premix  Status:  Discontinued     2 g 200 mL/hr over 30 Minutes Intravenous Every 6 hours 01/10/17 1358 01/10/17 1407   01/10/17 0607  ceFAZolin (ANCEF) 2-4 GM/100ML-% IVPB    Comments:   Lorenza [place]   : cabinet override      01/10/17 0607 01/10/17 0806   01/10/17 0145  ceFAZolin (ANCEF) IVPB 2g/100 mL premix     2 g 200 mL/hr over 30 Minutes Intravenous  Once 01/10/17 0142 01/10/17 0836    .  She was fitted with AV 1 compression foot pump devices, instructed on heel pumps, early ambulation, and fitted with TED stockings bilaterally for DVT prophylaxis.  She benefited maximally from the hospital stay and there were no complications.    Recent vital signs:  Vitals:   01/11/17 0345 01/11/17 0722  BP: 127/63 (!) 136/54  Pulse: 99 (!) 101  Resp: 12 13  Temp: 99.4 F (37.4 C) 99.5 F (37.5 C)  SpO2: 93% 93%    Recent laboratory studies:  Lab Results  Component Value Date   HGB 12.1 01/11/2017   HGB 14.7 12/31/2016   HGB 13.8 05/06/2016   Lab Results  Component Value Date   WBC 14.2 (H) 01/11/2017   PLT 200 01/11/2017   Lab Results  Component Value Date   INR 0.91 12/31/2016   Lab Results  Component Value Date   NA 136 01/11/2017   K 3.8 01/11/2017   CL 100 (L) 01/11/2017   CO2 28 01/11/2017   BUN 15 01/11/2017   CREATININE 0.82 01/11/2017   GLUCOSE 134 (H) 01/11/2017    Discharge Medications:   Allergies as of 01/11/2017      Reactions   Paroxetine Hcl Other (See Comments)   "[place]"      Medication List    STOP taking these medications   aspirin 81 MG tablet     TAKE these medications   acetaminophen 500 MG tablet Commonly known as:  TYLENOL Take 500-1,000 mg by mouth every 6 (six) hours as needed for moderate pain or headache.   albuterol 108 (90 Base) MCG/ACT inhaler Commonly known as:  PROVENTIL HFA;VENTOLIN HFA Inhale 2 puffs into the lungs every 4 (four) hours as needed for wheezing or shortness of breath.   amLODipine 2.5 MG tablet Commonly known as:  NORVASC Take 2.5 mg by mouth daily.   CALCIUM-VITAMIN D PO Take 2 tablets by mouth daily.   diazepam 5 MG tablet Commonly known as:  VALIUM Take 1 tablet (5 mg  total) by mouth every 8 (eight) hours as needed for anxiety or muscle spasms.   enoxaparin 40 MG/0.4ML injection Commonly known as:  LOVENOX Inject 0.4 mLs (40 mg total) into the skin daily.   ferrous sulfate 325 (65 FE) MG tablet Take 1 tablet (325 mg total) by mouth 2 (two) times daily with a meal.   Fluticasone-Salmeterol 250-50 MCG/DOSE Aepb Commonly known as:  ADVAIR DISKUS Inhale 1 puff into the lungs 2 (two) times daily. What changed:  when to take this  reasons to take this   hydrochlorothiazide 25 MG tablet Commonly known as:  HYDRODIURIL Take 25 mg by mouth daily.   HYDROcodone-acetaminophen 5-325 MG tablet Commonly known as:  NORCO/VICODIN Take 1-2 tablets by mouth every 6 (six)  hours as needed for moderate pain.   loratadine 10 MG tablet Commonly known as:  CLARITIN Take 10 mg by mouth daily as needed for allergies.   metoCLOPramide 5 MG tablet Commonly known as:  REGLAN Take 5 mg by mouth 4 (four) times daily -  before meals and at bedtime.   Oxycodone HCl 10 MG Tabs Take 1 tablet (10 mg total) by mouth every 3 (three) hours as needed for severe pain ((score 7 to 10)).   pantoprazole 40 MG tablet Commonly known as:  PROTONIX Take 40 mg by mouth daily.   SYSTANE OP Place 1 drop into both eyes as needed (for dry eyes).   traMADol 50 MG tablet Commonly known as:  ULTRAM Take 1 tablet (50 mg total) by mouth every 4 (four) hours as needed. What changed:  when to take this  reasons to take this   Vitamin D (Ergocalciferol) 50000 units Caps capsule Commonly known as:  DRISDOL Take 50,000 Units by mouth every Monday.       Diagnostic Studies: Ct Shoulder Right Wo Contrast  Result Date: 01/03/2017 CLINICAL DATA:  Right shoulder pain for 5 years.  No known injury. EXAM: CT OF THE UPPER RIGHT EXTREMITY WITHOUT CONTRAST TECHNIQUE: Multidetector CT imaging of the upper right extremity was performed according to the standard protocol. COMPARISON:  None.  FINDINGS: Bones/Joint/Cartilage The patient has moderately severe appearing glenohumeral osteoarthritis with marked joint space narrowing a prominent osteophyte off the inferior margin of the humeral head. Osteophytosis off the inferior and posterior margins of the glenoid is also identified. A loose body measuring 0.9 cm in diameter is seen along the biceps tendon in the superior aspect of the bicipital groove. Moderate to moderately severe acromioclavicular osteoarthritis is also seen. The acromion is type 2 with a prominent subacromial spur. No fracture or focal bony lesion is identified. Ligaments Suboptimally assessed by CT. Muscles and Tendons No rotator cuff tear is visualized on CT scan. All imaged musculature is preserved without atrophy or focal lesion. Soft tissues Imaged lung parenchyma is clear. Atherosclerotic vascular disease is noted. IMPRESSION: Moderately severe appearing glenohumeral osteoarthritis. Moderate to moderately severe acromioclavicular osteoarthritis. Type 2 acromion with subacromial spurring also seen. Intact appearing rotator cuff. Atherosclerosis. Electronically Signed   By: Inge Rise M.D.   On: 01/03/2017 14:00   Dg Shoulder Right Port  Result Date: 01/10/2017 CLINICAL DATA:  Right shoulder replacement EXAM: PORTABLE RIGHT SHOULDER COMPARISON:  CT 01/03/2017 FINDINGS: Changes of right shoulder replacement. No hardware bony complicating feature. Advanced degenerative changes in the right AC joint. IMPRESSION: Right shoulder replacement.  No visible complicating feature. Electronically Signed   By: Rolm Baptise M.D.   On: 01/10/2017 12:08    Disposition: 01-Home or Self Care  Discharge Instructions    Diet - low sodium heart healthy    Complete by:  As directed    Increase activity slowly    Complete by:  As directed        Contact information for follow-up providers    Poggi, Marshall Cork, MD Follow up in 2 week(s).   Specialty:  Surgery Why:  For staple  removal Contact information: [address] [facility] [place] [address] [phone]            Contact information for after-discharge care    Destination    [facility] .   Specialty:  [place] information: [address] [place] [phone]  Signed: Kirby Argueta R. 01/11/2017, 2:17 PM

## 2017-01-11 NOTE — Anesthesia Postprocedure Evaluation (Signed)
Anesthesia Post Note  Patient: Yolanda Taylor  Procedure(s) Performed: TOTAL SHOULDER ARTHROPLASTY (Right ) ROTATOR CUFF REPAIR SHOULDER OPEN (Right Shoulder)  Patient location during evaluation: PACU Anesthesia Type: General Level of consciousness: awake and alert and oriented Pain management: pain level controlled Vital Signs Assessment: post-procedure vital signs reviewed and stable Respiratory status: spontaneous breathing Cardiovascular status: blood pressure returned to baseline Anesthetic complications: no Comments: Noted possible left corneal abrasion in PACU after patient rubbed eyes.  Dr. Wallace Going consulted and gave orders for antibiotic ointment.  Chest discomfort resolved and Dr. Clayborn Bigness cleared patient     Last Vitals:  Vitals:   01/11/17 0345 01/11/17 0722  BP: 127/63 (!) 136/54  Pulse: 99 (!) 101  Resp: 12 13  Temp: 37.4 C 37.5 C  SpO2: 93% 93%    Last Pain:  Vitals:   01/11/17 0722  TempSrc: Oral  PainSc:                  [name]

## 2017-01-14 ENCOUNTER — Other Ambulatory Visit: Payer: Self-pay

## 2017-01-14 LAB — SURGICAL PATHOLOGY

## 2017-01-14 MED ORDER — HYDROCODONE-ACETAMINOPHEN 5-325 MG PO TABS: 1.0000 | ORAL_TABLET | Freq: Four times a day (QID) | ORAL | 0 refills | Status: DC | PRN

## 2017-01-14 NOTE — Telephone Encounter (Signed)
Rx sent to [facility] phone : [phone] , fax : [phone]  

## 2017-01-17 ENCOUNTER — Encounter
Admission: RE | Admit: 2017-01-17 | Discharge: 2017-01-17 | Disposition: A | Discharge status: Home / Self Care | Payer: Medicare Other | Source: Ambulatory Visit | Attending: Internal Medicine | Admitting: Internal Medicine

## 2017-01-18 ENCOUNTER — Non-Acute Institutional Stay (SKILLED_NURSING_FACILITY): Payer: Medicare Other | Admitting: Gerontology

## 2017-01-18 ENCOUNTER — Encounter: Payer: Self-pay | Admitting: Gerontology

## 2017-01-18 DIAGNOSIS — M19011 Primary osteoarthritis, right shoulder: Secondary | ICD-10-CM

## 2017-01-18 DIAGNOSIS — Z96611 Presence of right artificial shoulder joint: Secondary | ICD-10-CM

## 2017-01-21 ENCOUNTER — Non-Acute Institutional Stay (SKILLED_NURSING_FACILITY): Payer: Medicare Other | Admitting: Gerontology

## 2017-01-21 DIAGNOSIS — Z96611 Presence of right artificial shoulder joint: Secondary | ICD-10-CM

## 2017-01-21 DIAGNOSIS — M19011 Primary osteoarthritis, right shoulder: Secondary | ICD-10-CM | POA: Diagnosis not present

## 2017-01-22 ENCOUNTER — Encounter: Payer: Self-pay | Admitting: Gerontology

## 2017-01-22 NOTE — Progress Notes (Signed)
Location:   [facility] Room Number: 201B Place of Service:  SNF [id number])  Provider: Toni Arthurs, NP-C  PCP: Dion Body, MD Patient Care Team: Dion Body, MD as PCP - General (Family Medicine)  Extended Emergency Contact Information Primary Emergency Contact: Oris Drone Address: [address]          [place], [place] [address] Johnnette Litter of [place] Phone: [phone] Work Phone: [phone] Relation: Daughter  Code Status: FULL Goals of care:  Advanced Directive information Advanced Directives 01/21/2017  Does Patient Have a Medical Advance Directive? No  Type of Advance Directive -  Does patient want to make changes to medical advance directive? No - Patient declined  Copy of [place] in Chart? -  Would patient like information on creating a medical advance directive? -     Allergies  Allergen Reactions  . Paroxetine Hcl Other (See Comments)    "Woozie"    Chief Complaint  Patient presents with  . Discharge Note    Discharged from SNF    HPI:  83 y.o. female seen today for discharge evaluation.  Patient was admitted to the facility for rehab following admission to [facility] status post right total shoulder arthroplasty.  Patient has been participating in PT/OT.  She is progressing well.  Patient reports pain is well controlled on current regimen.  Patient and staff does report patient does have some increased anxiety related to therapy and discharge planning.  Patient does have home dose of Valium as needed for anxiety.  Patient reports appetite is good, voiding without difficulty and having regular BMs.  Incision is well approximated.  No redness, warmth or drainage.  Sling in place.  Patient reports that even though she does have some concerns about going home, she is ready to go home and feels safe.  Vital signs stable.  No other complaints.    Past Medical History:  Diagnosis Date  . Anxiety    unspecified  . Arthritis   . Borderline diabetes   . Breast cancer ([place]) 2006   right breast, radiation  . Cancer ([place])   . COPD (chronic obstructive pulmonary disease) ([place])   . Emphysema lung ([place])   . Essential hypertension   . Gastroparesis   . GERD (gastroesophageal reflux disease)   . Hemorrhoid   . History of anesthesia reaction    pain with procedure 03/08/2014; very sedated after surgery  . History of hiatal hernia   . History of kidney stones   . Hypertension   . Nephrolithiasis   . Plantar fascial fibromatosis   . PONV (postoperative nausea and vomiting)    unspecified; after surgery 03/08/2014  . Pre-diabetes   . Pure hypercholesterolemia   . TIA (transient ischemic attack)   . Vertigo   . Vitamin D deficiency, unspecified     Past Surgical History:  Procedure Laterality Date  . BREAST EXCISIONAL BIOPSY Left 2001   neg  . BREAST EXCISIONAL BIOPSY Right 2006   positive, lumpectomy  . BREAST SURGERY    . CHOLECYSTECTOMY    . COLONOSCOPY     04/04/1998; 08/23/2000; 08/19/2003; 02/24/2009; 12/18/2012  . EYE SURGERY Right 03/02/2014   Procedure: REMOVAL OF FOREIGN BODY, INTRAOCULAR; FROM POSTERIOR SEGMENT, NONMAGNETIC EXTRACTION (IOL); Surgeon: Dorna Mai, MD; Location: [facility]; Service: Ophthalmology; Laterality: Right;  . HERNIA REPAIR    . HIATAL HERNIA REPAIR  2014   Laproscopic hiatal hernia repair with fundoplication ; Dr. Tamala Julian  .  HIP FRACTURE SURGERY Left 09/2014  . INTRAOCULAR LENS EXCHANGE Right 03/29/2014   Procedure: EXCHANGE INTRAOCULAR LENS; Surgeon: Ross Ludwig, MD; Location: [facility]; Service: Ophthalmology; Laterality: Right;  . POLYPECTOMY  2000  . REPOSITIONING INTRAOCULAR LENS Right 03/02/2014   Procedure: REPOSITIONING INTRAOCULAR LENS; Surgeon: Ross Ludwig, MD; Location: [facility]; Service: Ophthalmology; Laterality: Right;  . REPOSITIONING INTRAOCULAR LENS Right 03/08/2014   Procedure: REPOSITIONING  INTRAOCULAR LENS; Surgeon: Ross Ludwig, MD; Location: [facility]; Service: Ophthalmology; Laterality: Right; pt had surgery yesterday needs to have lens repositioned come from a long way away NAME ALERT  . TOTAL ABDOMINAL HYSTERECTOMY W/ BILATERAL SALPINGOOPHORECTOMY  1976  . UPPER GASTROINTESTINAL ENDOSCOPY  04/11/2012  . UPPER GASTROINTESTINAL ENDOSCOPY  07/25/2012      reports that she quit smoking about 47 years ago. Her smoking use included cigarettes. She has a 17.00 pack-year smoking history. she has never used smokeless tobacco. She reports that she does not drink alcohol or use drugs. Social History   Socioeconomic History  . Marital status: Widowed    Spouse name: Not on file  . Number of children: 1  . Years of education: 74  . Highest education level: High school graduate  Social Needs  . Financial resource strain: Not on file  . Food insecurity - worry: Not on file  . Food insecurity - inability: Not on file  . Transportation needs - medical: Not on file  . Transportation needs - non-medical: Not on file  Occupational History  . Not on file  Tobacco Use  . Smoking status: Former Smoker    Packs/day: 1.00    Years: 17.00    Pack years: 17.00    Types: Cigarettes    Last attempt to quit: 03/19/1969    Years since quitting: 47.8  . Smokeless tobacco: Never Used  Substance and Sexual Activity  . Alcohol use: No  . Drug use: No  . Sexual activity: No  Other Topics Concern  . Not on file  Social History Narrative  . Not on file   Functional Status Survey:    Allergies  Allergen Reactions  . Paroxetine Hcl Other (See Comments)    "Woozie"    Pertinent  Health Maintenance Due  Topic Date Due  . DEXA SCAN  01/21/1998  . PNA vac Low Risk Adult (2 of 2 - PCV13) 11/16/2008  . INFLUENZA VACCINE  10/17/2016    Medications: Allergies as of 01/21/2017      Reactions   Paroxetine Hcl Other (See Comments)   "Woozie"      Medication List         Accurate as of 01/21/17 11:59 PM. Always use your most recent med list.          acetaminophen 500 MG tablet Commonly known as:  TYLENOL Take 500-1,000 mg by mouth every 6 (six) hours as needed for moderate pain or headache.   albuterol 108 (90 Base) MCG/ACT inhaler Commonly known as:  PROVENTIL HFA;VENTOLIN HFA Inhale 2 puffs into the lungs every 4 (four) hours as needed for wheezing or shortness of breath.   amLODipine 2.5 MG tablet Commonly known as:  NORVASC Take 2.5 mg by mouth daily.   calcium-vitamin D 500-200 MG-UNIT tablet Commonly known as:  OSCAL WITH D Take 2 tablets by mouth daily with breakfast.   diazepam 5 MG tablet Commonly known as:  VALIUM Take 1 tablet (5 mg total) by mouth every 8 (eight) hours as needed for  anxiety or muscle spasms.   enoxaparin 40 MG/0.4ML injection Commonly known as:  LOVENOX Inject 0.4 mLs (40 mg total) into the skin daily.   ferrous sulfate 325 (65 FE) MG tablet Take 1 tablet (325 mg total) by mouth 2 (two) times daily with a meal.   Fluticasone-Salmeterol 250-50 MCG/DOSE Aepb Commonly known as:  ADVAIR Inhale 1 puff into the lungs 2 (two) times daily.   hydrochlorothiazide 25 MG tablet Commonly known as:  HYDRODIURIL Take 25 mg by mouth daily.   loratadine 10 MG tablet Commonly known as:  CLARITIN Take 10 mg by mouth daily as needed for allergies.   metoCLOPramide 5 MG tablet Commonly known as:  REGLAN Take 5 mg by mouth 4 (four) times daily -  before meals and at bedtime.   Oxycodone HCl 10 MG Tabs Take 1 tablet (10 mg total) by mouth every 3 (three) hours as needed for severe pain ((score 7 to 10)).   pantoprazole 40 MG tablet Commonly known as:  PROTONIX Take 40 mg by mouth daily.   SYSTANE 0.4-0.3 % Soln Generic drug:  Polyethyl Glycol-Propyl Glycol Place 1 drop into both eyes as needed.   traMADol 50 MG tablet Commonly known as:  ULTRAM Take 50 mg by mouth every 4 (four) hours as needed. For pain level 1 -  6   Vitamin D (Ergocalciferol) 50000 units Caps capsule Commonly known as:  DRISDOL Take 50,000 Units by mouth every Monday.       Review of Systems  Constitutional: Negative for activity change, appetite change, chills, diaphoresis and fever.  HENT: Negative for congestion, sneezing, sore throat, trouble swallowing and voice change.   Respiratory: Negative for apnea, cough, choking, chest tightness, shortness of breath and wheezing.   Cardiovascular: Negative for chest pain, palpitations and leg swelling.  Gastrointestinal: Negative for abdominal distention, abdominal pain, constipation, diarrhea and nausea.  Genitourinary: Negative for difficulty urinating, dysuria, frequency and urgency.  Musculoskeletal: Positive for arthralgias (typical arthritis). Negative for back pain, gait problem and myalgias.  Skin: Positive for wound. Negative for color change, pallor and rash.  Neurological: Negative for dizziness, tremors, syncope, speech difficulty, weakness, numbness and headaches.  Psychiatric/Behavioral: Negative for agitation and behavioral problems.  All other systems reviewed and are negative.   Vitals:   01/21/17 1005  BP: (!) 101/47  Pulse: 93  Resp: 20  Temp: 98.1 F (36.7 C)  TempSrc: Oral  SpO2: 95%  Weight: 151 lb 1.6 oz (68.5 kg)  Height: 5\' 1"  (1.549 m)   Body mass index is 28.55 kg/m. Physical Exam  Constitutional: She is oriented to person, place, and time. Vital signs are normal. She appears well-developed and well-nourished. She is active and cooperative. She does not appear ill. No distress.  HENT:  Head: Normocephalic and atraumatic.  Mouth/Throat: Uvula is midline, oropharynx is clear and moist and mucous membranes are normal. Mucous membranes are not pale, not dry and not cyanotic.  Eyes: Conjunctivae, EOM and lids are normal. Pupils are equal, round, and reactive to light.  Neck: Trachea normal, normal range of motion and full passive range of motion  without pain. Neck supple. No JVD present. No tracheal deviation, no edema and no erythema present. No thyromegaly present.  Cardiovascular: Normal rate, regular rhythm, normal heart sounds, intact distal pulses and normal pulses. Exam reveals no gallop, no distant heart sounds and no friction rub.  No murmur heard. Pulses:      Radial pulses are 2+ on the right side, and  2+ on the left side.       Dorsalis pedis pulses are 2+ on the right side, and 2+ on the left side.  No edema  Pulmonary/Chest: Effort normal and breath sounds normal. No accessory muscle usage. No respiratory distress. She has no decreased breath sounds. She has no wheezes. She has no rhonchi. She has no rales. She exhibits no tenderness.  Abdominal: Normal appearance and bowel sounds are normal. She exhibits no distension and no ascites. There is no tenderness.  Musculoskeletal: She exhibits no edema.       Right shoulder: She exhibits decreased range of motion and tenderness.  Expected osteoarthritis, stiffness; calves soft, supple.  Negative Homans sign.  Resolving bruising to right shoulder.  Compartments soft  Neurological: She is alert and oriented to person, place, and time. She has normal strength.  Skin: Skin is warm and dry. Bruising (resolving) and laceration noted. She is not diaphoretic. No cyanosis. No pallor. Nails show no clubbing.  Psychiatric: She has a normal mood and affect. Her speech is normal and behavior is normal. Judgment and thought content normal. Cognition and memory are normal.  Nursing note and vitals reviewed.   Labs reviewed: Basic Metabolic Panel: Recent Labs    05/06/16 2205 12/31/16 1305 01/11/17 0757  NA 138 138 136  K 3.6 3.8 3.8  CL 103 98* 100*  CO2 25 30 28   GLUCOSE 115* 92 134*  BUN 20 18 15   CREATININE 1.00 0.72 0.82  CALCIUM 9.0 9.8 8.4*   Liver Function Tests: No results for input(s): AST, ALT, ALKPHOS, BILITOT, PROT, ALBUMIN in the last 8760 hours. No results for  input(s): LIPASE, AMYLASE in the last 8760 hours. No results for input(s): AMMONIA in the last 8760 hours. CBC: Recent Labs    05/06/16 2205 12/31/16 1305 01/11/17 0757  WBC 11.5* 9.8 14.2*  NEUTROABS  --   --  11.4*  HGB 13.8 14.7 12.1  HCT 41.9 43.6 36.3  MCV 90.5 92.5 91.8  PLT 261 236 200   Cardiac Enzymes: Recent Labs    05/06/16 2205  TROPONINI <0.03   BNP: Invalid input(s): POCBNP CBG: No results for input(s): GLUCAP in the last 8760 hours.  Procedures and Imaging Studies During Stay: Ct Shoulder Right Wo Contrast  Result Date: 01/03/2017 CLINICAL DATA:  Right shoulder pain for 5 years.  No known injury. EXAM: CT OF THE UPPER RIGHT EXTREMITY WITHOUT CONTRAST TECHNIQUE: Multidetector CT imaging of the upper right extremity was performed according to the standard protocol. COMPARISON:  None. FINDINGS: Bones/Joint/Cartilage The patient has moderately severe appearing glenohumeral osteoarthritis with marked joint space narrowing a prominent osteophyte off the inferior margin of the humeral head. Osteophytosis off the inferior and posterior margins of the glenoid is also identified. A loose body measuring 0.9 cm in diameter is seen along the biceps tendon in the superior aspect of the bicipital groove. Moderate to moderately severe acromioclavicular osteoarthritis is also seen. The acromion is type 2 with a prominent subacromial spur. No fracture or focal bony lesion is identified. Ligaments Suboptimally assessed by CT. Muscles and Tendons No rotator cuff tear is visualized on CT scan. All imaged musculature is preserved without atrophy or focal lesion. Soft tissues Imaged lung parenchyma is clear. Atherosclerotic vascular disease is noted. IMPRESSION: Moderately severe appearing glenohumeral osteoarthritis. Moderate to moderately severe acromioclavicular osteoarthritis. Type 2 acromion with subacromial spurring also seen. Intact appearing rotator cuff. Atherosclerosis. Electronically  Signed   By: Inge Rise M.D.   On:  01/03/2017 14:00   Dg Shoulder Right Port  Result Date: 01/10/2017 CLINICAL DATA:  Right shoulder replacement EXAM: PORTABLE RIGHT SHOULDER COMPARISON:  CT 01/03/2017 FINDINGS: Changes of right shoulder replacement. No hardware bony complicating feature. Advanced degenerative changes in the right AC joint. IMPRESSION: Right shoulder replacement.  No visible complicating feature. Electronically Signed   By: Rolm Baptise M.D.   On: 01/10/2017 12:08    Assessment/Plan:   1.  Primary osteoarthritis of right shoulder 2.  Status post total shoulder arthroplasty, right  Continue PT/OT  Continue exercises taught by PT/OT  Ice to shoulder as needed for pain or swelling  Sling in place at all times when out of bed  Continue Tylenol 500 mg 1-2 tablets p.o. every 6 hours as needed pain  Continue tramadol 50 mg 1 tab p.o. every 4 hours as needed pain #20, no refill  Follow-up with orthopedist as instructed for continuity of care  Patient is being discharged with the following home health services: PT/OT/vestibular care through [facility]  Patient is being discharged with the following durable medical equipment: 4-prong cane-borrowed from a friend  Patient has been advised to f/u with their PCP in 1-2 weeks to bring them up to date on their rehab stay.  Social services at facility was responsible for arranging this appointment.  Pt was provided with a 30 day supply of prescriptions for medications and refills must be obtained from their PCP.  For controlled substances, a more limited supply may be provided adequate until PCP appointment only.  Future labs/tests needed:    Family/ staff Communication:   Total Time:  Documentation:  Face to Face:  Family/Phone:  Vikki Ports, NP-C [facility] [address], [place] [address] Cell Phone (Mon-Fri 8am-5pm):  [phone] On Call:   [phone] & follow prompts after 5pm & weekends Office Phone:  [phone] Office Fax:  [phone]

## 2017-02-07 DIAGNOSIS — M19011 Primary osteoarthritis, right shoulder: Secondary | ICD-10-CM | POA: Insufficient documentation

## 2017-02-07 NOTE — Progress Notes (Signed)
Location:    Nursing Home Room Number: 201B Place of Service:  SNF (31) Provider:  Toni Arthurs, NP-C  Dion Body, MD  Patient Care Team: Dion Body, MD as PCP - General (Family Medicine)  Extended Emergency Contact Information Primary Emergency Contact: Oris Drone Address: [address]          [place], [place] [address] Johnnette Litter of [place] Phone: [phone] Work Phone: [phone] Relation: Daughter  Code Status:  Full Goals of care: Advanced Directive information Advanced Directives 01/21/2017  Does Patient Have a Medical Advance Directive? No  Type of Advance Directive -  Does patient want to make changes to medical advance directive? No - Patient declined  Copy of [place] in Chart? -  Would patient like information on creating a medical advance directive? -     Chief Complaint  Patient presents with  . Medical Management of Chronic Issues    Routine Visit    HPI:  Pt is a 83 y.o. female seen today for a hospital f/u s/p admission from [facility] status post right total shoulder arthroplasty.  Patient has been participating in PT/OT.  She is progressing well.  Patient reports pain is well controlled on current regimen.  Patient and staff does report patient does have some increased anxiety related to therapy and discharge planning.  Patient does have home dose of Valium as needed for anxiety.  Patient reports appetite is good, voiding without difficulty and having regular BMs.  Incision is well approximated.  No redness, warmth, or drainage.  Sling in place.  Vital signs stable.  No other complaints.  Past Medical History:  Diagnosis Date  . Anxiety    unspecified  . Arthritis   . Borderline diabetes   . Breast cancer ([place]) 2006   right breast, radiation  . Cancer ([place])   . COPD (chronic obstructive pulmonary disease) ([place])   . Emphysema lung ([place])   . Essential hypertension   . Gastroparesis   . GERD (gastroesophageal  reflux disease)   . Hemorrhoid   . History of anesthesia reaction    pain with procedure 03/08/2014; very sedated after surgery  . History of hiatal hernia   . History of kidney stones   . Hypertension   . Nephrolithiasis   . Plantar fascial fibromatosis   . PONV (postoperative nausea and vomiting)    unspecified; after surgery 03/08/2014  . Pre-diabetes   . Pure hypercholesterolemia   . TIA (transient ischemic attack)   . Vertigo   . Vitamin D deficiency, unspecified    Past Surgical History:  Procedure Laterality Date  . BREAST EXCISIONAL BIOPSY Left 2001   neg  . BREAST EXCISIONAL BIOPSY Right 2006   positive, lumpectomy  . BREAST SURGERY    . CHOLECYSTECTOMY    . COLONOSCOPY     04/04/1998; 08/23/2000; 08/19/2003; 02/24/2009; 12/18/2012  . EYE SURGERY Right 03/02/2014   Procedure: REMOVAL OF FOREIGN BODY, INTRAOCULAR; FROM POSTERIOR SEGMENT, NONMAGNETIC EXTRACTION (IOL); Surgeon: Dorna Mai, MD; Location: [facility]; Service: Ophthalmology; Laterality: Right;  . HERNIA REPAIR    . HIATAL HERNIA REPAIR  2014   Laproscopic hiatal hernia repair with fundoplication ; Dr. Tamala Julian  . HIP FRACTURE SURGERY Left 09/2014  . INTRAMEDULLARY (IM) NAIL INTERTROCHANTERIC Left 10/11/2014   Procedure: intermedullary nailing of left subtrocanteric  femur  fracture;  Surgeon: Earnestine Leys, MD;  Location: [facility];  Service: Orthopedics;  Laterality: Left;  . INTRAOCULAR LENS EXCHANGE Right 03/29/2014  Procedure: EXCHANGE INTRAOCULAR LENS; Surgeon: Ross Ludwig, MD; Location: [facility]; Service: Ophthalmology; Laterality: Right;  . POLYPECTOMY  2000  . REPOSITIONING INTRAOCULAR LENS Right 03/02/2014   Procedure: REPOSITIONING INTRAOCULAR LENS; Surgeon: Ross Ludwig, MD; Location: [place]; Service: Ophthalmology; Laterality: Right;  . REPOSITIONING INTRAOCULAR LENS Right 03/08/2014   Procedure: REPOSITIONING INTRAOCULAR LENS; Surgeon: Ross Ludwig, MD;  Location: [facility]; Service: Ophthalmology; Laterality: Right; pt had surgery yesterday needs to have lens repositioned come from a long way away NAME ALERT  . SHOULDER OPEN ROTATOR CUFF REPAIR Right 01/10/2017   Procedure: ROTATOR CUFF REPAIR SHOULDER OPEN;  Surgeon: Corky Mull, MD;  Location: [facility];  Service: Orthopedics;  Laterality: Right;  . TOTAL ABDOMINAL HYSTERECTOMY W/ BILATERAL SALPINGOOPHORECTOMY  1976  . TOTAL SHOULDER ARTHROPLASTY Right 01/10/2017   Procedure: TOTAL SHOULDER ARTHROPLASTY;  Surgeon: Corky Mull, MD;  Location: [facility];  Service: Orthopedics;  Laterality: Right;  . UPPER GASTROINTESTINAL ENDOSCOPY  04/11/2012  . UPPER GASTROINTESTINAL ENDOSCOPY  07/25/2012    Allergies  Allergen Reactions  . Paroxetine Hcl Other (See Comments)    "Woozie"    Allergies as of 01/18/2017      Reactions   Paroxetine Hcl Other (See Comments)   "Woozie"      Medication List        Accurate as of 01/18/17 11:59 PM. Always use your most recent med list.          acetaminophen 500 MG tablet Commonly known as:  TYLENOL Take 500-1,000 mg by mouth every 6 (six) hours as needed for moderate pain or headache.   albuterol 108 (90 Base) MCG/ACT inhaler Commonly known as:  PROVENTIL HFA;VENTOLIN HFA Inhale 2 puffs into the lungs every 4 (four) hours as needed for wheezing or shortness of breath.   amLODipine 2.5 MG tablet Commonly known as:  NORVASC Take 2.5 mg by mouth daily.   calcium-vitamin D 500-200 MG-UNIT tablet Commonly known as:  OSCAL WITH D Take 2 tablets by mouth daily with breakfast.   diazepam 5 MG tablet Commonly known as:  VALIUM Take 1 tablet (5 mg total) by mouth every 8 (eight) hours as needed for anxiety or muscle spasms.   enoxaparin 40 MG/0.4ML injection Commonly known as:  LOVENOX Inject 0.4 mLs (40 mg total) into the skin daily.   ferrous sulfate 325 (65 FE) MG tablet Take 1 tablet (325 mg total) by mouth 2 (two) times daily with a  meal.   Fluticasone-Salmeterol 250-50 MCG/DOSE Aepb Commonly known as:  ADVAIR Inhale 1 puff into the lungs 2 (two) times daily.   hydrochlorothiazide 25 MG tablet Commonly known as:  HYDRODIURIL Take 25 mg by mouth daily.   loratadine 10 MG tablet Commonly known as:  CLARITIN Take 10 mg by mouth daily as needed for allergies.   metoCLOPramide 5 MG tablet Commonly known as:  REGLAN Take 5 mg by mouth 4 (four) times daily -  before meals and at bedtime.   Oxycodone HCl 10 MG Tabs Take 1 tablet (10 mg total) by mouth every 3 (three) hours as needed for severe pain ((score 7 to 10)).   pantoprazole 40 MG tablet Commonly known as:  PROTONIX Take 40 mg by mouth daily.   SYSTANE 0.4-0.3 % Soln Generic drug:  Polyethyl Glycol-Propyl Glycol Place 1 drop into both eyes as needed.   traMADol 50 MG tablet Commonly known as:  ULTRAM Take 50 mg by mouth every 4 (four) hours as  needed. For pain level 1 - 6   Vitamin D (Ergocalciferol) 50000 units Caps capsule Commonly known as:  DRISDOL Take 50,000 Units by mouth every Monday.       Review of Systems  Constitutional: Negative for activity change, appetite change, chills, diaphoresis and fever.  HENT: Negative for congestion, sneezing, sore throat, trouble swallowing and voice change.   Respiratory: Negative for apnea, cough, choking, chest tightness, shortness of breath and wheezing.   Cardiovascular: Negative for chest pain, palpitations and leg swelling.  Gastrointestinal: Negative for abdominal distention, abdominal pain, constipation, diarrhea and nausea.  Genitourinary: Negative for difficulty urinating, dysuria, frequency and urgency.  Musculoskeletal: Positive for arthralgias (typical arthritis). Negative for back pain, gait problem and myalgias.  Skin: Positive for wound. Negative for color change, pallor and rash.  Neurological: Negative for dizziness, tremors, syncope, speech difficulty, weakness, numbness and headaches.    Psychiatric/Behavioral: Negative for agitation and behavioral problems.  All other systems reviewed and are negative.   Immunization History  Administered Date(s) Administered  . Influenza-Unspecified 01/01/2015, 12/18/2015  . Pneumococcal Polysaccharide-23 11/17/2007   Pertinent  Health Maintenance Due  Topic Date Due  . DEXA SCAN  01/21/1998  . PNA vac Low Risk Adult (2 of 2 - PCV13) 11/16/2008  . INFLUENZA VACCINE  10/17/2016   No flowsheet data found. Functional Status Survey:    Vitals:   01/18/17 1232  BP: 124/68  Pulse: 84  Resp: 20  Temp: 98 F (36.7 C)  SpO2: 96%  Weight: 158 lb 3.2 oz (71.8 kg)  Height: 5\' 1"  (1.549 m)   Body mass index is 29.89 kg/m. Physical Exam  Constitutional: She is oriented to person, place, and time. Vital signs are normal. She appears well-developed and well-nourished. She is active and cooperative. She does not appear ill. No distress.  HENT:  Head: Normocephalic and atraumatic.  Mouth/Throat: Uvula is midline, oropharynx is clear and moist and mucous membranes are normal. Mucous membranes are not pale, not dry and not cyanotic.  Eyes: Conjunctivae, EOM and lids are normal. Pupils are equal, round, and reactive to light.  Neck: Trachea normal, normal range of motion and full passive range of motion without pain. Neck supple. No JVD present. No tracheal deviation, no edema and no erythema present. No thyromegaly present.  Cardiovascular: Normal rate, regular rhythm, normal heart sounds, intact distal pulses and normal pulses. Exam reveals no gallop, no distant heart sounds and no friction rub.  No murmur heard. Pulses:      Radial pulses are 2+ on the right side, and 2+ on the left side.       Dorsalis pedis pulses are 2+ on the right side, and 2+ on the left side.  No edema  Pulmonary/Chest: Effort normal and breath sounds normal. No accessory muscle usage. No respiratory distress. She has no decreased breath sounds. She has no  wheezes. She has no rhonchi. She has no rales. She exhibits no tenderness.  Abdominal: Normal appearance and bowel sounds are normal. She exhibits no distension and no ascites. There is no tenderness.  Musculoskeletal: She exhibits no edema.       Right shoulder: She exhibits decreased range of motion, tenderness and laceration.  Expected osteoarthritis, stiffness; calves soft, supple.  Negative Homans sign.  Mild swelling to right shoulder  Neurological: She is alert and oriented to person, place, and time. She has normal strength.  Skin: Skin is warm and dry. Bruising (mild- to right shoulder- resolving) and laceration noted. She is  not diaphoretic. No cyanosis. No pallor. Nails show no clubbing.  Psychiatric: She has a normal mood and affect. Her speech is normal and behavior is normal. Judgment and thought content normal. Cognition and memory are normal.  Nursing note and vitals reviewed.   Labs reviewed: Recent Labs    05/06/16 2205 12/31/16 1305 01/11/17 0757  NA 138 138 136  K 3.6 3.8 3.8  CL 103 98* 100*  CO2 25 30 28   GLUCOSE 115* 92 134*  BUN 20 18 15   CREATININE 1.00 0.72 0.82  CALCIUM 9.0 9.8 8.4*   No results for input(s): AST, ALT, ALKPHOS, BILITOT, PROT, ALBUMIN in the last 8760 hours. Recent Labs    05/06/16 2205 12/31/16 1305 01/11/17 0757  WBC 11.5* 9.8 14.2*  NEUTROABS  --   --  11.4*  HGB 13.8 14.7 12.1  HCT 41.9 43.6 36.3  MCV 90.5 92.5 91.8  PLT 261 236 200   No results found for: TSH No results found for: HGBA1C No results found for: CHOL, HDL, LDLCALC, LDLDIRECT, TRIG, CHOLHDL  Significant Diagnostic Results in last 30 days:  No results found.  Assessment/Plan 1. Primary osteoarthritis of right shoulder 2. Status post total shoulder arthroplasty, right  Continue PT/OT  Continue exercises as taught by PT/OT  Ice to the shoulder as needed for pain or swelling  Sling in place at all times when out of bed  Continue Lovenox 40 mg subcu  every 24 hours for DVT prophylaxis  Continue Tylenol 500 mg 1-2 tablets p.o. every 6 hours as needed pain  Continue tramadol 50 mg 1 tab p.o. every 4 hours as needed pain  Follow-up with orthopedist as instructed    Family/ staff Communication:   Total Time:  Documentation:  Face to Face:  Family/Phone:   Labs/tests ordered:    Vikki Ports, NP-C [facility] [address], [place] [address] Cell Phone (Mon-Fri 8am-5pm):  [phone] On Call:  [phone] & follow prompts after 5pm & weekends Office Phone:  [phone] Office Fax:  [phone]

## 2017-04-11 ENCOUNTER — Encounter: Payer: Self-pay | Admitting: Certified Registered Nurse Anesthetist

## 2017-04-11 ENCOUNTER — Ambulatory Visit: Payer: Medicare Other

## 2017-04-11 ENCOUNTER — Ambulatory Visit: Payer: Medicare Other | Admitting: Anesthesiology

## 2017-04-11 ENCOUNTER — Encounter
Admission: RE | Disposition: A | Discharge status: Home / Self Care | Payer: Self-pay | Source: Ambulatory Visit | Attending: Gastroenterology

## 2017-04-11 ENCOUNTER — Ambulatory Visit
Admission: RE | Admit: 2017-04-11 | Discharge: 2017-04-11 | Disposition: A | Discharge status: Home / Self Care | Payer: Medicare Other | Source: Ambulatory Visit | Attending: Gastroenterology | Admitting: Gastroenterology

## 2017-04-11 DIAGNOSIS — Z800 Family history of malignant neoplasm of digestive organs: Secondary | ICD-10-CM | POA: Insufficient documentation

## 2017-04-11 DIAGNOSIS — Z87891 Personal history of nicotine dependence: Secondary | ICD-10-CM | POA: Diagnosis not present

## 2017-04-11 DIAGNOSIS — R1013 Epigastric pain: Secondary | ICD-10-CM | POA: Diagnosis not present

## 2017-04-11 DIAGNOSIS — K219 Gastro-esophageal reflux disease without esophagitis: Secondary | ICD-10-CM | POA: Diagnosis not present

## 2017-04-11 DIAGNOSIS — M1990 Unspecified osteoarthritis, unspecified site: Secondary | ICD-10-CM | POA: Insufficient documentation

## 2017-04-11 DIAGNOSIS — F419 Anxiety disorder, unspecified: Secondary | ICD-10-CM | POA: Insufficient documentation

## 2017-04-11 DIAGNOSIS — Z79899 Other long term (current) drug therapy: Secondary | ICD-10-CM | POA: Insufficient documentation

## 2017-04-11 DIAGNOSIS — Z8673 Personal history of transient ischemic attack (TIA), and cerebral infarction without residual deficits: Secondary | ICD-10-CM | POA: Diagnosis not present

## 2017-04-11 DIAGNOSIS — E559 Vitamin D deficiency, unspecified: Secondary | ICD-10-CM | POA: Insufficient documentation

## 2017-04-11 DIAGNOSIS — I10 Essential (primary) hypertension: Secondary | ICD-10-CM | POA: Insufficient documentation

## 2017-04-11 DIAGNOSIS — J439 Emphysema, unspecified: Secondary | ICD-10-CM | POA: Diagnosis not present

## 2017-04-11 DIAGNOSIS — Z888 Allergy status to other drugs, medicaments and biological substances status: Secondary | ICD-10-CM | POA: Insufficient documentation

## 2017-04-11 DIAGNOSIS — Z853 Personal history of malignant neoplasm of breast: Secondary | ICD-10-CM | POA: Diagnosis not present

## 2017-04-11 DIAGNOSIS — K449 Diaphragmatic hernia without obstruction or gangrene: Secondary | ICD-10-CM | POA: Insufficient documentation

## 2017-04-11 DIAGNOSIS — E7800 Pure hypercholesterolemia, unspecified: Secondary | ICD-10-CM | POA: Insufficient documentation

## 2017-04-11 HISTORY — PX: ESOPHAGOGASTRODUODENOSCOPY (EGD) WITH PROPOFOL: SHX5813

## 2017-04-11 SURGERY — ESOPHAGOGASTRODUODENOSCOPY (EGD) WITH PROPOFOL
Anesthesia: General

## 2017-04-11 MED ORDER — SODIUM CHLORIDE 0.9 % IV SOLN: INTRAVENOUS | Status: DC

## 2017-04-11 MED ORDER — LIDOCAINE HCL (CARDIAC) 20 MG/ML IV SOLN
INTRAVENOUS | Status: DC | PRN
  Administered 2017-04-11: 50 mg via INTRAVENOUS

## 2017-04-11 MED ORDER — PROPOFOL 500 MG/50ML IV EMUL
INTRAVENOUS | Status: DC | PRN
  Administered 2017-04-11: 140 ug/kg/min via INTRAVENOUS

## 2017-04-11 MED ORDER — IPRATROPIUM-ALBUTEROL 0.5-2.5 (3) MG/3ML IN SOLN
RESPIRATORY_TRACT | Status: AC
  Filled 2017-04-11: qty 3

## 2017-04-11 MED ORDER — IPRATROPIUM-ALBUTEROL 0.5-2.5 (3) MG/3ML IN SOLN
3.0000 mL | Freq: Once | RESPIRATORY_TRACT | Status: AC
  Administered 2017-04-11: 3 mL via RESPIRATORY_TRACT

## 2017-04-11 MED ORDER — PROPOFOL 500 MG/50ML IV EMUL
INTRAVENOUS | Status: AC
Start: 2017-04-11 — End: ?
  Filled 2017-04-11: qty 50

## 2017-04-11 MED ORDER — SODIUM CHLORIDE 0.9 % IV SOLN
INTRAVENOUS | Status: DC
  Administered 2017-04-11: 1000 mL via INTRAVENOUS

## 2017-04-11 MED ORDER — PROPOFOL 10 MG/ML IV BOLUS
INTRAVENOUS | Status: DC | PRN
  Administered 2017-04-11: 80 mg via INTRAVENOUS

## 2017-04-11 MED ORDER — LIDOCAINE HCL (PF) 2 % IJ SOLN
INTRAMUSCULAR | Status: AC
  Filled 2017-04-11: qty 10

## 2017-04-11 NOTE — Anesthesia Postprocedure Evaluation (Signed)
Anesthesia Post Note  Patient: Yolanda Taylor  Procedure(s) Performed: ESOPHAGOGASTRODUODENOSCOPY (EGD) WITH PROPOFOL (N/A )  Patient location during evaluation: Endoscopy Anesthesia Type: General Level of consciousness: awake and alert Pain management: pain level controlled Vital Signs Assessment: post-procedure vital signs reviewed and stable Respiratory status: spontaneous breathing, nonlabored ventilation, respiratory function stable and patient connected to nasal cannula oxygen Cardiovascular status: blood pressure returned to baseline and stable Postop Assessment: no apparent nausea or vomiting Anesthetic complications: no     Last Vitals:  Vitals:   04/11/17 1312 04/11/17 1322  BP: 122/67 119/69  Pulse: 83 78  Resp: 16 16  Temp:    SpO2: 98% 96%    Last Pain:  Vitals:   04/11/17 1252  TempSrc: Tympanic  PainSc:                  Precious Haws Adana Marik

## 2017-04-11 NOTE — H&P (Signed)
Outpatient short stay form Pre-procedure 04/11/2017 12:09 PM Yolanda Sails MD  Primary Physician: Dr Dion Body  Reason for visit:  EGD  History of present illness:  Patient is a 84 year old female presenting today as above. She has a history of gastroparesis and has had intermittent pain in the epigastric region. Further she does have a family history of gastric cancer and a primary relative her sister. He seems to better when she adheres to her low residue diet but that has been difficult at times for her. She does take a proton pump inhibitor and she is taking low-dose Reglan 4 times a day. She denies use of any aspirin or blood thinning agent.    Current Facility-Administered Medications:  .  0.9 %  sodium chloride infusion, , Intravenous, Continuous, Yolanda Sails, MD, Last Rate: 20 mL/hr at 04/11/17 1119, 1,000 mL at 04/11/17 1119 .  0.9 %  sodium chloride infusion, , Intravenous, Continuous, Yolanda Sails, MD .  ipratropium-albuterol (DUONEB) 0.5-2.5 (3) MG/3ML nebulizer solution, , , ,   Medications Prior to Admission  Medication Sig Dispense Refill Last Dose  . acetaminophen (TYLENOL) 500 MG tablet Take 500-1,000 mg by mouth every 6 (six) hours as needed for moderate pain or headache.    04/10/2017 at Unknown time  . albuterol (PROVENTIL HFA;VENTOLIN HFA) 108 (90 Base) MCG/ACT inhaler Inhale 2 puffs into the lungs every 4 (four) hours as needed for wheezing or shortness of breath. 1 Inhaler 0 04/10/2017 at Unknown time  . amLODipine (NORVASC) 2.5 MG tablet Take 2.5 mg by mouth daily.    04/11/2017 at 0800  . calcium-vitamin D (OSCAL WITH D) 500-200 MG-UNIT tablet Take 2 tablets by mouth daily with breakfast.   Past Week at Unknown time  . Fluticasone-Salmeterol (ADVAIR) 250-50 MCG/DOSE AEPB Inhale 1 puff into the lungs 2 (two) times daily.   04/10/2017 at Unknown time  . hydrochlorothiazide (HYDRODIURIL) 25 MG tablet Take 25 mg by mouth daily.    04/11/2017 at 0800  .  loratadine (CLARITIN) 10 MG tablet Take 10 mg by mouth daily as needed for allergies.   Past Week at Unknown time  . meclizine (ANTIVERT) 25 MG tablet Take 25 mg by mouth 3 (three) times daily as needed for dizziness.   Past Week at Unknown time  . metoCLOPramide (REGLAN) 5 MG tablet Take 5 mg by mouth 4 (four) times daily -  before meals and at bedtime.    04/10/2017 at Unknown time  . pantoprazole (PROTONIX) 40 MG tablet Take 40 mg by mouth daily.    04/10/2017 at Unknown time  . sucralfate (CARAFATE) 1 g tablet Take 1 g by mouth 2 (two) times daily.   04/10/2017 at Unknown time  . traMADol (ULTRAM) 50 MG tablet Take 50 mg by mouth every 4 (four) hours as needed. For pain level 1 - 6   Past Week at Unknown time  . Vitamin D, Ergocalciferol, (DRISDOL) 50000 UNITS CAPS capsule Take 50,000 Units by mouth every Monday.    Past Week at Unknown time  . diazepam (VALIUM) 5 MG tablet Take 1 tablet (5 mg total) by mouth every 8 (eight) hours as needed for anxiety or muscle spasms. (Patient not taking: Reported on 04/10/2017) 30 tablet 0 Not Taking at Unknown time  . enoxaparin (LOVENOX) 40 MG/0.4ML injection Inject 0.4 mLs (40 mg total) into the skin daily. (Patient not taking: Reported on 04/11/2017) 14 Syringe 0 Not Taking at Unknown time  . ferrous sulfate 325 (  65 FE) MG tablet Take 1 tablet (325 mg total) by mouth 2 (two) times daily with a meal. (Patient not taking: Reported on 04/10/2017) 60 tablet 0 Not Taking at Unknown time  . oxyCODONE 10 MG TABS Take 1 tablet (10 mg total) by mouth every 3 (three) hours as needed for severe pain ((score 7 to 10)). (Patient not taking: Reported on 04/10/2017) 30 tablet 0 Completed Course at Unknown time  . Polyethyl Glycol-Propyl Glycol (SYSTANE) 0.4-0.3 % SOLN Place 1 drop into both eyes as needed.   Not Taking at Unknown time     Allergies  Allergen Reactions  . Buspar [Buspirone]   . Fluoxetine Hcl   . Lexapro [Escitalopram Oxalate]   . Paroxetine Hcl Other (See  Comments)    "Woozie"  . Venlafaxine Hcl   . Zoloft [Sertraline Hcl]      Past Medical History:  Diagnosis Date  . Anxiety    unspecified  . Arthritis   . Borderline diabetes   . Breast cancer ([facility]) 2006   right breast, radiation  . Cancer ([place])   . COPD (chronic obstructive pulmonary disease) ([facility])   . Emphysema lung ([place])   . Essential hypertension   . Gastroparesis   . GERD (gastroesophageal reflux disease)   . Hemorrhoid   . History of anesthesia reaction    pain with procedure 03/08/2014; very sedated after surgery  . History of hiatal hernia   . History of kidney stones   . Hypertension   . Nephrolithiasis   . Plantar fascial fibromatosis   . PONV (postoperative nausea and vomiting)    unspecified; after surgery 03/08/2014  . Pre-diabetes   . Pure hypercholesterolemia   . TIA (transient ischemic attack)   . Vertigo   . Vitamin D deficiency, unspecified     Review of systems:      Physical Exam    Heart and lungs: Regular rate and rhythm without rub or gallop, lungs are bilaterally clear.    HEENT: Normocephalic atraumatic eyes are anicteric    Other:     Pertinant exam for procedure: Soft nontender nondistended bowel sounds positive normoactive.    Planned proceedures: EGD and indicated procedures.have discussed the risks benefits and complications of procedures to include not limited to bleeding, infection, perforation and the risk of sedation and the patient wishes to proceed.    Yolanda Sails, MD Gastroenterology 04/11/2017  12:09 PM

## 2017-04-11 NOTE — Anesthesia Preprocedure Evaluation (Signed)
Anesthesia Evaluation  Patient identified by MRN, date of birth, ID band Patient awake    Reviewed: Allergy & Precautions, H&P , NPO status , Patient's Chart, lab work & pertinent test results  History of Anesthesia Complications (+) PONV and history of anesthetic complications  Airway Mallampati: III  TM Distance: <3 FB Neck ROM: limited    Dental  (+) Chipped, Poor Dentition   Pulmonary neg shortness of breath, COPD, former smoker,           Cardiovascular Exercise Tolerance: Good hypertension, (-) angina(-) Past MI and (-) DOE      Neuro/Psych PSYCHIATRIC DISORDERS Anxiety TIA   GI/Hepatic Neg liver ROS, hiatal hernia, GERD  Medicated and Controlled,  Endo/Other  negative endocrine ROS  Renal/GU Renal disease  negative genitourinary   Musculoskeletal  (+) Arthritis ,   Abdominal   Peds  Hematology negative hematology ROS (+)   Anesthesia Other Findings Past Medical History: No date: Anxiety     Comment:  unspecified No date: Arthritis No date: Borderline diabetes 2006: Breast cancer ([place])     Comment:  right breast, radiation No date: Cancer ([place]) No date: COPD (chronic obstructive pulmonary disease) ([facility]) No date: Emphysema lung ([facility]) No date: Essential hypertension No date: Gastroparesis No date: GERD (gastroesophageal reflux disease) No date: Hemorrhoid No date: History of anesthesia reaction     Comment:  pain with procedure 03/08/2014; very sedated after               surgery No date: History of hiatal hernia No date: History of kidney stones No date: Hypertension No date: Nephrolithiasis No date: Plantar fascial fibromatosis No date: PONV (postoperative nausea and vomiting)     Comment:  unspecified; after surgery 03/08/2014 No date: Pre-diabetes No date: Pure hypercholesterolemia No date: TIA (transient ischemic attack) No date: Vertigo No date: Vitamin D deficiency, unspecified  Past  Surgical History: No date: ABDOMINAL HYSTERECTOMY No date: ANTERIOR FUSION CERVICAL SPINE 2001: BREAST EXCISIONAL BIOPSY; Left     Comment:  neg 2006: BREAST EXCISIONAL BIOPSY; Right     Comment:  positive, lumpectomy No date: BREAST SURGERY No date: CHOLECYSTECTOMY No date: COLONOSCOPY     Comment:  04/04/1998; 08/23/2000; 08/19/2003; 02/24/2009; 12/18/2012 No date: COLONOSCOPY 03/02/2014: EYE SURGERY; Right     Comment:  Procedure: REMOVAL OF FOREIGN BODY, INTRAOCULAR; FROM               POSTERIOR SEGMENT, NONMAGNETIC EXTRACTION (IOL); Surgeon:              Dorna Mai, MD; Location: [facility]; Service:               Ophthalmology; Laterality: Right; No date: HERNIA REPAIR 2014: HIATAL HERNIA REPAIR     Comment:  Laproscopic hiatal hernia repair with fundoplication ;               Dr. Tamala Julian 09/2014: HIP FRACTURE SURGERY; Left 10/11/2014: INTRAMEDULLARY (IM) NAIL INTERTROCHANTERIC; Left     Comment:  Procedure: intermedullary nailing of left subtrocanteric              femur  fracture;  Surgeon: Earnestine Leys, MD;  Location:               [facility];  Service: Orthopedics;  Laterality: Left; 03/29/2014: INTRAOCULAR LENS EXCHANGE; Right     Comment:  Procedure: EXCHANGE INTRAOCULAR LENS; Surgeon: Ross Ludwig, MD;  [facility]; Service:               Ophthalmology; Laterality: Right; No date: NISSEN FUNDOPLICATION 2876: POLYPECTOMY 03/02/2014: REPOSITIONING INTRAOCULAR LENS; Right     Comment:  Procedure: REPOSITIONING INTRAOCULAR LENS; Surgeon:               Ross Ludwig, MD; Location: [facility];               Service: Ophthalmology; Laterality: Right; 03/08/2014: REPOSITIONING INTRAOCULAR LENS; Right     Comment:  Procedure: REPOSITIONING INTRAOCULAR LENS; Surgeon:               Ross Ludwig, MD; Location: [facility];               Service: Ophthalmology; Laterality: Right; pt had surgery              yesterday needs to  have lens repositioned come from a               long way away NAME ALERT 01/10/2017: SHOULDER OPEN ROTATOR CUFF REPAIR; Right     Comment:  Procedure: ROTATOR CUFF REPAIR SHOULDER OPEN;  Surgeon:               Corky Mull, MD;  Location: [facility];  Service:               Orthopedics;  Laterality: Right; 1976: TOTAL ABDOMINAL HYSTERECTOMY W/ BILATERAL SALPINGOOPHORECTOMY 01/10/2017: TOTAL SHOULDER ARTHROPLASTY; Right     Comment:  Procedure: TOTAL SHOULDER ARTHROPLASTY;  Surgeon: Corky Mull, MD;  Location: [facility];  Service: Orthopedics;                Laterality: Right; 04/11/2012: UPPER GASTROINTESTINAL ENDOSCOPY 07/25/2012: UPPER GASTROINTESTINAL ENDOSCOPY No date: UPPER GI ENDOSCOPY  BMI    Body Mass Index:  26.52 kg/m      Reproductive/Obstetrics negative OB ROS                             Anesthesia Physical Anesthesia Plan  ASA: III  Anesthesia Plan: General   Post-op Pain Management:    Induction: Intravenous  PONV Risk Score and Plan: Propofol infusion  Airway Management Planned: Natural Airway and Nasal Cannula  Additional Equipment:   Intra-op Plan:   Post-operative Plan:   Informed Consent: I have reviewed the patients History and Physical, chart, labs and discussed the procedure including the risks, benefits and alternatives for the proposed anesthesia with the patient or authorized representative who has indicated his/her understanding and acceptance.   Dental Advisory Given  Plan Discussed with: Anesthesiologist, CRNA and Surgeon  Anesthesia Plan Comments: (Patient consented for risks of anesthesia including but not limited to:  - adverse reactions to medications - risk of intubation if required - damage to teeth, lips or other oral mucosa - sore throat or hoarseness - Damage to heart, brain, lungs or loss of life  Patient voiced understanding.)        Anesthesia Quick Evaluation

## 2017-04-11 NOTE — Anesthesia Post-op Follow-up Note (Signed)
Anesthesia QCDR form completed.        

## 2017-04-11 NOTE — Op Note (Signed)
[facility] Gastroenterology Patient Name: Yolanda Taylor Procedure Date: 04/11/2017 12:30 PM MRN: [id number] Account #: [id number] Date of Birth: [birth year] Admit Type: Outpatient Age: 84 Room: [facility] ENDO ROOM 4 Gender: Female Note Status: Finalized Procedure:            Upper GI endoscopy Providers:            Lollie Sails, MD Referring MD:         Dion Body (Referring MD) Medicines:            Monitored Anesthesia Care Complications:        No immediate complications. Procedure:            Pre-Anesthesia Assessment:                       - ASA Grade Assessment: III - A patient with severe                        systemic disease.                       After obtaining informed consent, the endoscope was                        passed under direct vision. Throughout the procedure,                        the patient's blood pressure, pulse, and oxygen                        saturations were monitored continuously. The Endoscope                        was introduced through the mouth, with the intention of                        advancing to the stomach. The scope was advanced to the                        antrum before the procedure was aborted. Medications                        were given. The upper GI endoscopy was extremely                        difficult due to presence of food. The patient                        tolerated the procedure well. Findings:      A small hiatal hernia was present.      A medium amount of food (residue) was found in the distal gastric body       and antrum. I was unable to see the anterior/lesser curvature and antrum       due to this debris.      The exam of the esophagus was otherwise normal. Impression:           - Small hiatal hernia.                       - A medium amount of food (residue) in the stomach.                       -  No specimens collected. Recommendation:       - Discharge patient to home.                  - Use metoclopramide 10 mg PO QID; 30 min AC and HS                        daily.                       - Clear liquid diet for 2 days, then advance as                        tolerated to low residue diet. Procedure Code(s):    --- Professional ---                       [phone], 51, Esophagogastroduodenoscopy, flexible,                        transoral; diagnostic, including collection of                        specimen(s) by brushing or washing, when performed                        (separate procedure) Diagnosis Code(s):    --- Professional ---                       K44.9, Diaphragmatic hernia without obstruction or                        gangrene CPT copyright 2016 American [employer] Association. All rights reserved. The codes documented in this report are preliminary and upon coder review may  be revised to meet current compliance requirements. Lollie Sails, MD 04/11/2017 12:58:45 PM This report has been signed electronically. Number of Addenda: 0 Note Initiated On: 04/11/2017 12:30 PM      [facility]

## 2017-04-11 NOTE — Transfer of Care (Signed)
Immediate Anesthesia Transfer of Care Note  Patient: Yolanda Taylor  Procedure(s) Performed: ESOPHAGOGASTRODUODENOSCOPY (EGD) WITH PROPOFOL (N/A )  Patient Location: PACU and Endoscopy Unit  Anesthesia Type:General  Level of Consciousness: awake, alert , oriented and patient cooperative  Airway & Oxygen Therapy: Patient Spontanous Breathing and Patient connected to nasal cannula oxygen  Post-op Assessment: Report given to RN and Post -op Vital signs reviewed and stable  Post vital signs: Reviewed and stable  Last Vitals:  Vitals:   04/11/17 1057 04/11/17 1252  BP: 122/65 116/90  Pulse: 88 92  Resp: 16 16  Temp: (!) 35.7 C (!) 36.1 C  SpO2: 97% 95%    Last Pain:  Vitals:   04/11/17 1252  TempSrc: Tympanic  PainSc:          Complications: No apparent anesthesia complications

## 2017-04-12 ENCOUNTER — Encounter: Payer: Self-pay | Admitting: Gastroenterology

## 2017-04-20 ENCOUNTER — Emergency Department: Payer: Medicare Other

## 2017-04-20 ENCOUNTER — Other Ambulatory Visit: Payer: Self-pay

## 2017-04-20 ENCOUNTER — Encounter: Payer: Self-pay | Admitting: Emergency Medicine

## 2017-04-20 ENCOUNTER — Emergency Department
Admission: EM | Admit: 2017-04-20 | Discharge: 2017-04-21 | Disposition: A | Discharge status: Home / Self Care | Payer: Medicare Other | Attending: Emergency Medicine | Admitting: Emergency Medicine

## 2017-04-20 DIAGNOSIS — Z79899 Other long term (current) drug therapy: Secondary | ICD-10-CM | POA: Insufficient documentation

## 2017-04-20 DIAGNOSIS — Z853 Personal history of malignant neoplasm of breast: Secondary | ICD-10-CM | POA: Insufficient documentation

## 2017-04-20 DIAGNOSIS — Z8673 Personal history of transient ischemic attack (TIA), and cerebral infarction without residual deficits: Secondary | ICD-10-CM | POA: Insufficient documentation

## 2017-04-20 DIAGNOSIS — R0602 Shortness of breath: Secondary | ICD-10-CM

## 2017-04-20 DIAGNOSIS — F419 Anxiety disorder, unspecified: Secondary | ICD-10-CM | POA: Diagnosis not present

## 2017-04-20 DIAGNOSIS — Z87891 Personal history of nicotine dependence: Secondary | ICD-10-CM | POA: Insufficient documentation

## 2017-04-20 DIAGNOSIS — I10 Essential (primary) hypertension: Secondary | ICD-10-CM | POA: Insufficient documentation

## 2017-04-20 DIAGNOSIS — J449 Chronic obstructive pulmonary disease, unspecified: Secondary | ICD-10-CM | POA: Diagnosis not present

## 2017-04-20 LAB — BASIC METABOLIC PANEL
Anion gap: 11 (ref 5–15)
BUN: 22 mg/dL — AB (ref 6–20)
CALCIUM: 9.8 mg/dL (ref 8.9–10.3)
CO2: 28 mmol/L (ref 22–32)
CREATININE: 0.89 mg/dL (ref 0.44–1.00)
Chloride: 101 mmol/L (ref 101–111)
GFR calc Af Amer: >60 mL/min (ref >60)
GFR calc non Af Amer: 58 mL/min — ABNORMAL LOW (ref >60)
GLUCOSE: 111 mg/dL — AB (ref 65–99)
Potassium: 4.1 mmol/L (ref 3.5–5.1)
Sodium: 140 mmol/L (ref 135–145)

## 2017-04-20 LAB — CBC
HCT: 46.1 % (ref 35.0–47.0)
HEMOGLOBIN: 14.9 g/dL (ref 12.0–16.0)
MCH: 29.4 pg (ref 26.0–34.0)
MCHC: 32.4 g/dL (ref 32.0–36.0)
MCV: 90.6 fL (ref 80.0–100.0)
PLATELETS: 263 10*3/uL (ref 150–440)
RBC: 5.08 MIL/uL (ref 3.80–5.20)
RDW: 13.7 % (ref 11.5–14.5)
WBC: 9.1 10*3/uL (ref 3.6–11.0)

## 2017-04-20 LAB — TROPONIN I: Troponin I: <0.03 ng/mL (ref <0.03)

## 2017-04-20 MED ORDER — IOPAMIDOL (ISOVUE-370) INJECTION 76%
75.0000 mL | Freq: Once | INTRAVENOUS | Status: AC | PRN
  Administered 2017-04-20: 75 mL via INTRAVENOUS

## 2017-04-20 MED ORDER — LORAZEPAM 2 MG/ML IJ SOLN
0.5000 mg | Freq: Once | INTRAMUSCULAR | Status: AC
  Administered 2017-04-20: 0.5 mg via INTRAVENOUS
  Filled 2017-04-20: qty 1

## 2017-04-20 NOTE — ED Provider Notes (Signed)
[facility] Emergency Department Provider Note  ____________________________________________   First MD Initiated Contact with Patient 04/20/17 2308     (approximate)  I have reviewed the triage vital signs and the nursing notes.   HISTORY  Chief Complaint Shortness of Breath    HPI Yolanda Taylor is a 84 y.o. female who self presents to the emergency department with several days of progressive shortness of breath.  She has a past medical history of COPD and anxiety.  Her shortness of breath is now moderate severity worse with exertion and somewhat improved with rest.  2 months ago she had a shoulder replaced.  She has no history of DVT or pulmonary embolism.  She does report sharp upper chest pain worse with cough improved with the coughing.  Past Medical History:  Diagnosis Date  . Anxiety    unspecified  . Arthritis   . Borderline diabetes   . Breast cancer ([place]) 2006   right breast, radiation  . Cancer ([place])   . COPD (chronic obstructive pulmonary disease) ([place])   . Emphysema lung ([place])   . Essential hypertension   . Gastroparesis   . GERD (gastroesophageal reflux disease)   . Hemorrhoid   . History of anesthesia reaction    pain with procedure 03/08/2014; very sedated after surgery  . History of hiatal hernia   . History of kidney stones   . Hypertension   . Nephrolithiasis   . Plantar fascial fibromatosis   . PONV (postoperative nausea and vomiting)    unspecified; after surgery 03/08/2014  . Pre-diabetes   . Pure hypercholesterolemia   . TIA (transient ischemic attack)   . Vertigo   . Vitamin D deficiency, unspecified     Patient Active Problem List   Diagnosis Date Noted  . Primary osteoarthritis of right shoulder 02/07/2017  . Status post total shoulder arthroplasty, right 01/10/2017  . Femoral condyle fracture ([place]) 10/11/2014    Past Surgical History:  Procedure Laterality Date  . ABDOMINAL HYSTERECTOMY    .  ANTERIOR FUSION CERVICAL SPINE    . BREAST EXCISIONAL BIOPSY Left 2001   neg  . BREAST EXCISIONAL BIOPSY Right 2006   positive, lumpectomy  . BREAST SURGERY    . CHOLECYSTECTOMY    . COLONOSCOPY     04/04/1998; 08/23/2000; 08/19/2003; 02/24/2009; 12/18/2012  . COLONOSCOPY    . ESOPHAGOGASTRODUODENOSCOPY (EGD) WITH PROPOFOL N/A 04/11/2017   Procedure: ESOPHAGOGASTRODUODENOSCOPY (EGD) WITH PROPOFOL;  Surgeon: Lollie Sails, MD;  Location: [facility] ENDOSCOPY;  Service: Endoscopy;  Laterality: N/A;  . EYE SURGERY Right 03/02/2014   Procedure: REMOVAL OF FOREIGN BODY, INTRAOCULAR; FROM POSTERIOR SEGMENT, NONMAGNETIC EXTRACTION (IOL); Surgeon: Dorna Mai, MD; Location: [place]; Service: Ophthalmology; Laterality: Right;  . HERNIA REPAIR    . HIATAL HERNIA REPAIR  2014   Laproscopic hiatal hernia repair with fundoplication ; Dr. Tamala Julian  . HIP FRACTURE SURGERY Left 09/2014  . INTRAMEDULLARY (IM) NAIL INTERTROCHANTERIC Left 10/11/2014   Procedure: intermedullary nailing of left subtrocanteric  femur  fracture;  Surgeon: Earnestine Leys, MD;  Location: [facility] ORS;  Service: Orthopedics;  Laterality: Left;  . INTRAOCULAR LENS EXCHANGE Right 03/29/2014   Procedure: EXCHANGE INTRAOCULAR LENS; Surgeon: Ross Ludwig, MD; Location: [place]; Service: Ophthalmology; Laterality: Right;  . NISSEN FUNDOPLICATION    . POLYPECTOMY  2000  . REPOSITIONING INTRAOCULAR LENS Right 03/02/2014   Procedure: REPOSITIONING INTRAOCULAR LENS; Surgeon: Ross Ludwig, MD; Location: [place]; Service: Ophthalmology; Laterality: Right;  .  REPOSITIONING INTRAOCULAR LENS Right 03/08/2014   Procedure: REPOSITIONING INTRAOCULAR LENS; Surgeon: Ross Ludwig, MD; Location: [place]; Service: Ophthalmology; Laterality: Right; pt had surgery yesterday needs to have lens repositioned come from a long way away NAME ALERT  . SHOULDER OPEN ROTATOR CUFF REPAIR Right 01/10/2017   Procedure: ROTATOR CUFF  REPAIR SHOULDER OPEN;  Surgeon: Corky Mull, MD;  Location: [facility] ORS;  Service: Orthopedics;  Laterality: Right;  . TOTAL ABDOMINAL HYSTERECTOMY W/ BILATERAL SALPINGOOPHORECTOMY  1976  . TOTAL SHOULDER ARTHROPLASTY Right 01/10/2017   Procedure: TOTAL SHOULDER ARTHROPLASTY;  Surgeon: Corky Mull, MD;  Location: [facility] ORS;  Service: Orthopedics;  Laterality: Right;  . UPPER GASTROINTESTINAL ENDOSCOPY  04/11/2012  . UPPER GASTROINTESTINAL ENDOSCOPY  07/25/2012  . UPPER GI ENDOSCOPY      Prior to Admission medications   Medication Sig Start Date End Date Taking? Authorizing Provider  acetaminophen (TYLENOL) 500 MG tablet Take 500-1,000 mg by mouth every 6 (six) hours as needed for moderate pain or headache.     [provider]  albuterol (PROVENTIL HFA;VENTOLIN HFA) 108 (90 Base) MCG/ACT inhaler Inhale 2 puffs into the lungs every 4 (four) hours as needed for wheezing or shortness of breath. 05/07/16   Paulette Blanch, MD  amLODipine (NORVASC) 2.5 MG tablet Take 2.5 mg by mouth daily.     [provider]  calcium-vitamin D (OSCAL WITH D) 500-200 MG-UNIT tablet Take 2 tablets by mouth daily with breakfast.    [provider]  diazepam (VALIUM) 5 MG tablet Take 1 tablet (5 mg total) by mouth every 8 (eight) hours as needed for anxiety or muscle spasms. Patient not taking: Reported on 04/10/2017 10/14/14   Epifanio Lesches, MD  enoxaparin (LOVENOX) 40 MG/0.4ML injection Inject 0.4 mLs (40 mg total) into the skin daily. Patient not taking: Reported on 04/11/2017 01/11/17   Watt Climes, PA  ferrous sulfate 325 (65 FE) MG tablet Take 1 tablet (325 mg total) by mouth 2 (two) times daily with a meal. Patient not taking: Reported on 04/10/2017 10/14/14   Epifanio Lesches, MD  Fluticasone-Salmeterol (ADVAIR) 250-50 MCG/DOSE AEPB Inhale 1 puff into the lungs 2 (two) times daily.    [provider]  hydrochlorothiazide (HYDRODIURIL) 25 MG tablet Take 25 mg by mouth daily.   09/27/14   [provider]  loratadine (CLARITIN) 10 MG tablet Take 10 mg by mouth daily as needed for allergies.    [provider]  LORazepam (ATIVAN) 0.5 MG tablet Take 1 tablet (0.5 mg total) by mouth every 8 (eight) hours as needed for up to 5 doses for anxiety. 04/21/17 04/29/17  Darel Hong, MD  meclizine (ANTIVERT) 25 MG tablet Take 25 mg by mouth 3 (three) times daily as needed for dizziness.    [provider]  metoCLOPramide (REGLAN) 5 MG tablet Take 5 mg by mouth 4 (four) times daily -  before meals and at bedtime.     [provider]  oxyCODONE 10 MG TABS Take 1 tablet (10 mg total) by mouth every 3 (three) hours as needed for severe pain ((score 7 to 10)). Patient not taking: Reported on 04/10/2017 01/11/17   Watt Climes, PA  pantoprazole (PROTONIX) 40 MG tablet Take 40 mg by mouth daily.  09/10/14   [provider]  Polyethyl Glycol-Propyl Glycol (SYSTANE) 0.4-0.3 % SOLN Place 1 drop into both eyes as needed.    [provider]  sucralfate (CARAFATE) 1 g tablet Take 1 g  by mouth 2 (two) times daily.    [provider]  traMADol (ULTRAM) 50 MG tablet Take 50 mg by mouth every 4 (four) hours as needed. For pain level 1 - 6    [provider]  Vitamin D, Ergocalciferol, (DRISDOL) 50000 UNITS CAPS capsule Take 50,000 Units by mouth every Monday.  08/17/14   [provider]    Allergies Buspar [buspirone]; Fluoxetine hcl; Lexapro [escitalopram oxalate]; Paroxetine hcl; Venlafaxine hcl; and Zoloft [sertraline hcl]  Family History  Problem Relation Age of Onset  . Breast cancer Paternal Aunt   . Nephrolithiasis Mother   . Heart attack Father   . Coronary artery disease Sister   . Cancer Brother   . Heart attack Brother   . Glaucoma Neg Hx   . Macular degeneration Neg Hx     Social History Social History   Tobacco Use  . Smoking status: Former Smoker    Packs/day: 1.00    Years: 17.00    Pack  years: 17.00    Types: Cigarettes    Last attempt to quit: 03/19/1969    Years since quitting: 48.1  . Smokeless tobacco: Never Used  Substance Use Topics  . Alcohol use: No  . Drug use: No    Review of Systems Constitutional: No fever/chills Eyes: No visual changes. ENT: No sore throat. Cardiovascular: Denies chest pain. Respiratory: Positive for shortness of breath. Gastrointestinal: No abdominal pain.  No nausea, no vomiting.  No diarrhea.  No constipation. Genitourinary: Negative for dysuria. Musculoskeletal: Negative for back pain. Skin: Negative for rash. Neurological: Negative for headaches, focal weakness or numbness.   ____________________________________________   PHYSICAL EXAM:  VITAL SIGNS: ED Triage Vitals [04/20/17 1805]  Enc Vitals Group     BP 131/66     Pulse Rate 82     Resp 18     Temp 97.8 F (36.6 C)     Temp Source Oral     SpO2 98 %     Weight 145 lb (65.8 kg)     Height 5\' 2"  (1.575 m)     Head Circumference      Peak Flow      Pain Score 0     Pain Loc      Pain Edu?      Excl. in [place]?     Constitutional: Alert and oriented x4 appears anxious with elevated respiratory rate Eyes: PERRL EOMI. Head: Atraumatic. Nose: No congestion/rhinnorhea. Mouth/Throat: No trismus Neck: No stridor.   Cardiovascular: Normal rate, regular rhythm. Grossly normal heart sounds.  Good peripheral circulation. Respiratory: Normal respiratory effort.  No retractions. Lungs CTAB and moving good air Gastrointestinal: Soft nontender Musculoskeletal right lower extremity slightly edematous and larger than the left Neurologic:  Normal speech and language. No gross focal neurologic deficits are appreciated. Skin:  Skin is warm, dry and intact. No rash noted. Psychiatric: Anxious appearing   ____________________________________________   DIFFERENTIAL includes but not limited to  Pulmonary embolism, COPD exacerbation, pneumonia, pneumothorax, acute coronary  syndrome ____________________________________________   LABS (all labs ordered are listed, but only abnormal results are displayed)  Labs Reviewed  BASIC METABOLIC PANEL - Abnormal; Notable for the following components:      Result Value   Glucose, Bld 111 (*)    BUN 22 (*)    GFR calc non Af Amer 58 (*)    All other components within normal limits  CBC  TROPONIN I    Lab work reviewed by me with  no acute disease __________________________________________  EKG  ED ECG REPORT I, Darel Hong, the attending physician, personally viewed and interpreted this ECG.  Date: 04/20/2017 EKG Time:  Rate: 82 Rhythm: normal sinus rhythm QRS Axis: Leftward axis Intervals: normal ST/T Wave abnormalities: normal Narrative Interpretation: no evidence of acute ischemia  ____________________________________________  RADIOLOGY  Chest x-ray reviewed by me with chronic changes but no acute disease CT angiogram of the chest reviewed by me with no acute disease ____________________________________________   PROCEDURES  Procedure(s) performed: no  Procedures  Critical Care performed: no  Observation: no ____________________________________________   INITIAL IMPRESSION / ASSESSMENT AND PLAN / ED COURSE  Pertinent labs & imaging results that were available during my care of the patient were reviewed by me and considered in my medical decision making (see chart for details).  Patient reports anxiety and exertional shortness of breath.  I appreciate that she does have a history of COPD however her lungs are clear at this time.  2 months ago she had surgery on her shoulder and today she has some right lower extremity swelling raising concern for deep vein thrombosis and pulmonary embolism.  CT angiogram pending.     ----------------------------------------- 1:19 AM on 04/21/2017 -----------------------------------------  Fortunately the patient's CT scan is negative.  After  0.5 mg of lorazepam her symptoms are nearly resolved.  I do lengthy discussion with patient regarding anxiety I will prescribe her a short course of Ativan for the next week and referral back to her primary care physician.  He verbalizes understanding and agreement with the plan. ____________________________________________   FINAL CLINICAL IMPRESSION(S) / ED DIAGNOSES  Final diagnoses:  Shortness of breath  Anxiety      NEW MEDICATIONS STARTED DURING THIS VISIT:  New Prescriptions   LORAZEPAM (ATIVAN) 0.5 MG TABLET    Take 1 tablet (0.5 mg total) by mouth every 8 (eight) hours as needed for up to 5 doses for anxiety.     Note:  This document was prepared using Dragon voice recognition software and may include unintentional dictation errors.     Darel Hong, MD 04/21/17 0120

## 2017-04-20 NOTE — ED Notes (Signed)
Patient transported to CT 

## 2017-04-20 NOTE — ED Notes (Signed)
ED Provider at bedside. 

## 2017-04-20 NOTE — ED Triage Notes (Signed)
Pt to triage in [facility] in [facility], report SOB over past few days, using inhaler at home w/ limited relief.  Pt reports hx COPD and anxiety but states she doesn't think it's anxiety.  Pt denies CP but reports some discomfort.

## 2017-04-21 MED ORDER — LORAZEPAM 0.5 MG PO TABS: 0.5000 mg | ORAL_TABLET | Freq: Three times a day (TID) | ORAL | 0 refills | Status: AC | PRN

## 2017-04-21 NOTE — Discharge Instructions (Signed)
Fortunately today your blood work, your EKG, and your CT scan were reassuring.  Please use her Ativan only as needed for severe symptoms and follow-up with your primary care physician this week for reevaluation.  Return to the emergency department sooner for any concerns.  It was a pleasure to take care of you today, and thank you for coming to our emergency department.  If you have any questions or concerns before leaving please ask the nurse to grab me and I'm more than happy to go through your aftercare instructions again.  If you were prescribed any opioid pain medication today such as Norco, Vicodin, Percocet, morphine, hydrocodone, or oxycodone please make sure you do not drive when you are taking this medication as it can alter your ability to drive safely.  If you have any concerns once you are home that you are not improving or are in fact getting worse before you can make it to your follow-up appointment, please do not hesitate to call 911 and come back for further evaluation.  Darel Hong, MD  Results for orders placed or performed during the hospital encounter of 35/00/93  Basic metabolic panel  Result Value Ref Range   Sodium 140 135 - 145 mmol/L   Potassium 4.1 3.5 - 5.1 mmol/L   Chloride 101 101 - 111 mmol/L   CO2 28 22 - 32 mmol/L   Glucose, Bld 111 (H) 65 - 99 mg/dL   BUN 22 (H) 6 - 20 mg/dL   Creatinine, Ser 0.89 0.44 - 1.00 mg/dL   Calcium 9.8 8.9 - 10.3 mg/dL   GFR calc non Af Amer 58 (L) >60 mL/min   GFR calc Af Amer >60 >60 mL/min   Anion gap 11 5 - 15  CBC  Result Value Ref Range   WBC 9.1 3.6 - 11.0 K/uL   RBC 5.08 3.80 - 5.20 MIL/uL   Hemoglobin 14.9 12.0 - 16.0 g/dL   HCT 46.1 35.0 - 47.0 %   MCV 90.6 80.0 - 100.0 fL   MCH 29.4 26.0 - 34.0 pg   MCHC 32.4 32.0 - 36.0 g/dL   RDW 13.7 11.5 - 14.5 %   Platelets 263 150 - 440 K/uL  Troponin I  Result Value Ref Range   Troponin I <0.03 <0.03 ng/mL   Dg Chest 2 View  Result Date: 04/20/2017 CLINICAL DATA:   Shortness of breath for a few days. History of COPD. Chest discomfort. EXAM: CHEST  2 VIEW COMPARISON:  05/06/2016 FINDINGS: Normal heart size and pulmonary vascularity. Scarring in the lung bases is similar to previous study. Peribronchial thickening and interstitial changes suggesting chronic bronchitis. Emphysematous changes in the lungs. No airspace disease or consolidation. No blunting of costophrenic angles. No pneumothorax. Mediastinal contours appear intact. Calcification of the aorta. Degenerative changes in the spine. Postoperative changes in the right shoulder. Surgical clips in the right upper quadrant. IMPRESSION: Emphysematous and chronic bronchitic changes in the lungs. Scarring in the lower lungs is similar to previous study. No active consolidation. Aortic atherosclerosis. Electronically Signed   By: Lucienne Capers M.D.   On: 04/20/2017 21:54   Ct Angio Chest Pe W/cm &/or Wo Cm  Result Date: 04/21/2017 CLINICAL DATA:  Shortness of breath. EXAM: CT ANGIOGRAPHY CHEST WITH CONTRAST TECHNIQUE: Multidetector CT imaging of the chest was performed using the standard protocol during bolus administration of intravenous contrast. Multiplanar CT image reconstructions and MIPs were obtained to evaluate the vascular anatomy. CONTRAST:  40mL ISOVUE-370 IOPAMIDOL (ISOVUE-370) INJECTION 76%  COMPARISON:  Radiographs of same day.  CT scan of March 06, 2012. FINDINGS: Cardiovascular: Satisfactory opacification of the pulmonary arteries to the segmental level. No evidence of pulmonary embolism. Normal heart size. No pericardial effusion. Atherosclerosis of thoracic aorta is noted without aneurysm or dissection. Mediastinum/Nodes: No enlarged mediastinal, hilar, or axillary lymph nodes. Thyroid gland, trachea, and esophagus demonstrate no significant findings. Lungs/Pleura: Lungs are clear. No pleural effusion or pneumothorax. Upper Abdomen: No acute abnormality. Musculoskeletal: No chest wall abnormality. No  acute or significant osseous findings. Review of the MIP images confirms the above findings. IMPRESSION: No definite evidence of pulmonary embolus. No acute abnormality seen in the chest. Aortic Atherosclerosis (ICD10-I70.0). Electronically Signed   By: Marijo Conception, M.D.   On: 04/21/2017 00:10   Dg Abd 2 Views  Result Date: 04/11/2017 CLINICAL DATA:  Preprocedural study.  History of diarrhea. EXAM: ABDOMEN - 2 VIEW COMPARISON:  10/16/2016. FINDINGS: Distention surgical clips upper abdomen. Stool noted. No bowel throughout the colon. Aortoiliac atherosclerotic vascular calcification. Pelvic calcifications consistent phleboliths. Rounded prominent calcific density none the lower pelvis may represent a fibroid. This is stable. Degenerative changes lumbar spine and both hips. Postsurgical changes left hip. IMPRESSION: Stool noted throughout the colon. No bowel distention. No acute abnormality. Electronically Signed   By: Marcello Moores  Register   On: 04/11/2017 10:42

## 2017-04-21 NOTE — ED Notes (Signed)
Patient back from CT.

## 2017-05-02 ENCOUNTER — Ambulatory Visit
Admission: RE | Admit: 2017-05-02 | Discharge: 2017-05-02 | Disposition: A | Discharge status: Home / Self Care | Payer: Medicare Other | Source: Ambulatory Visit | Attending: Gastroenterology | Admitting: Gastroenterology

## 2017-05-02 ENCOUNTER — Encounter: Payer: Self-pay | Admitting: *Deleted

## 2017-05-02 ENCOUNTER — Other Ambulatory Visit: Payer: Self-pay | Admitting: Gastroenterology

## 2017-05-02 DIAGNOSIS — Z9884 Bariatric surgery status: Secondary | ICD-10-CM | POA: Diagnosis not present

## 2017-05-02 DIAGNOSIS — R1084 Generalized abdominal pain: Secondary | ICD-10-CM

## 2017-05-02 DIAGNOSIS — Z9049 Acquired absence of other specified parts of digestive tract: Secondary | ICD-10-CM | POA: Insufficient documentation

## 2017-05-03 ENCOUNTER — Encounter: Payer: Self-pay | Admitting: Anesthesiology

## 2017-05-03 ENCOUNTER — Ambulatory Visit: Payer: Medicare Other | Admitting: Anesthesiology

## 2017-05-03 ENCOUNTER — Other Ambulatory Visit: Payer: Self-pay

## 2017-05-03 ENCOUNTER — Encounter
Admission: RE | Disposition: A | Discharge status: Home / Self Care | Payer: Self-pay | Source: Ambulatory Visit | Attending: Gastroenterology

## 2017-05-03 ENCOUNTER — Ambulatory Visit
Admission: RE | Admit: 2017-05-03 | Discharge: 2017-05-03 | Disposition: A | Discharge status: Home / Self Care | Payer: Medicare Other | Source: Ambulatory Visit | Attending: Gastroenterology | Admitting: Gastroenterology

## 2017-05-03 DIAGNOSIS — K449 Diaphragmatic hernia without obstruction or gangrene: Secondary | ICD-10-CM | POA: Insufficient documentation

## 2017-05-03 DIAGNOSIS — B3781 Candidal esophagitis: Secondary | ICD-10-CM | POA: Insufficient documentation

## 2017-05-03 DIAGNOSIS — Z7951 Long term (current) use of inhaled steroids: Secondary | ICD-10-CM | POA: Insufficient documentation

## 2017-05-03 DIAGNOSIS — Z79891 Long term (current) use of opiate analgesic: Secondary | ICD-10-CM | POA: Diagnosis not present

## 2017-05-03 DIAGNOSIS — K219 Gastro-esophageal reflux disease without esophagitis: Secondary | ICD-10-CM | POA: Insufficient documentation

## 2017-05-03 DIAGNOSIS — Z87891 Personal history of nicotine dependence: Secondary | ICD-10-CM | POA: Insufficient documentation

## 2017-05-03 DIAGNOSIS — Z79899 Other long term (current) drug therapy: Secondary | ICD-10-CM | POA: Insufficient documentation

## 2017-05-03 DIAGNOSIS — E559 Vitamin D deficiency, unspecified: Secondary | ICD-10-CM | POA: Diagnosis not present

## 2017-05-03 DIAGNOSIS — Z853 Personal history of malignant neoplasm of breast: Secondary | ICD-10-CM | POA: Insufficient documentation

## 2017-05-03 DIAGNOSIS — J439 Emphysema, unspecified: Secondary | ICD-10-CM | POA: Diagnosis not present

## 2017-05-03 DIAGNOSIS — Z8673 Personal history of transient ischemic attack (TIA), and cerebral infarction without residual deficits: Secondary | ICD-10-CM | POA: Diagnosis not present

## 2017-05-03 DIAGNOSIS — F419 Anxiety disorder, unspecified: Secondary | ICD-10-CM | POA: Insufficient documentation

## 2017-05-03 DIAGNOSIS — I10 Essential (primary) hypertension: Secondary | ICD-10-CM | POA: Diagnosis not present

## 2017-05-03 DIAGNOSIS — K3184 Gastroparesis: Secondary | ICD-10-CM | POA: Diagnosis present

## 2017-05-03 DIAGNOSIS — Z923 Personal history of irradiation: Secondary | ICD-10-CM | POA: Insufficient documentation

## 2017-05-03 DIAGNOSIS — Z96619 Presence of unspecified artificial shoulder joint: Secondary | ICD-10-CM | POA: Diagnosis not present

## 2017-05-03 HISTORY — PX: ESOPHAGOGASTRODUODENOSCOPY (EGD) WITH PROPOFOL: SHX5813

## 2017-05-03 SURGERY — ESOPHAGOGASTRODUODENOSCOPY (EGD) WITH PROPOFOL
Anesthesia: General

## 2017-05-03 MED ORDER — SUCCINYLCHOLINE CHLORIDE 20 MG/ML IJ SOLN
INTRAMUSCULAR | Status: AC
  Filled 2017-05-03: qty 1

## 2017-05-03 MED ORDER — FENTANYL CITRATE (PF) 100 MCG/2ML IJ SOLN
INTRAMUSCULAR | Status: DC | PRN
  Administered 2017-05-03: 50 ug via INTRAVENOUS

## 2017-05-03 MED ORDER — FENTANYL CITRATE (PF) 100 MCG/2ML IJ SOLN: 25.0000 ug | INTRAMUSCULAR | Status: DC | PRN

## 2017-05-03 MED ORDER — SODIUM CHLORIDE 0.9 % IV SOLN
INTRAVENOUS | Status: AC
  Filled 2017-05-03: qty 2000

## 2017-05-03 MED ORDER — SODIUM CHLORIDE 0.9 % IV SOLN
INTRAVENOUS | Status: DC
  Administered 2017-05-03 (×2): via INTRAVENOUS

## 2017-05-03 MED ORDER — PROPOFOL 500 MG/50ML IV EMUL
INTRAVENOUS | Status: AC
  Filled 2017-05-03: qty 50

## 2017-05-03 MED ORDER — PROPOFOL 10 MG/ML IV BOLUS
INTRAVENOUS | Status: DC | PRN
  Administered 2017-05-03: 80 mg via INTRAVENOUS

## 2017-05-03 MED ORDER — PROPOFOL 10 MG/ML IV BOLUS
INTRAVENOUS | Status: AC
  Filled 2017-05-03: qty 20

## 2017-05-03 MED ORDER — ONDANSETRON HCL 4 MG/2ML IJ SOLN: 4.0000 mg | Freq: Once | INTRAMUSCULAR | Status: DC | PRN

## 2017-05-03 MED ORDER — LIDOCAINE HCL (PF) 2 % IJ SOLN
INTRAMUSCULAR | Status: AC
  Filled 2017-05-03: qty 10

## 2017-05-03 MED ORDER — FENTANYL CITRATE (PF) 100 MCG/2ML IJ SOLN
INTRAMUSCULAR | Status: AC
Start: 2017-05-03 — End: 2017-05-03
  Filled 2017-05-03: qty 2

## 2017-05-03 MED ORDER — SODIUM CHLORIDE 0.9 % IV SOLN
2.0000 g | Freq: Once | INTRAVENOUS | Status: AC
  Administered 2017-05-03: 2 g via INTRAVENOUS

## 2017-05-03 MED ORDER — PROPOFOL 500 MG/50ML IV EMUL
INTRAVENOUS | Status: DC | PRN
  Administered 2017-05-03: 140 ug/kg/min via INTRAVENOUS

## 2017-05-03 NOTE — Anesthesia Postprocedure Evaluation (Signed)
Anesthesia Post Note  Patient: Yolanda Taylor  Procedure(Taylor) Performed: ESOPHAGOGASTRODUODENOSCOPY (EGD) WITH PROPOFOL (N/A )  Patient location during evaluation: PACU Anesthesia Type: General Level of consciousness: awake and alert Pain management: pain level controlled Vital Signs Assessment: post-procedure vital signs reviewed and stable Respiratory status: spontaneous breathing, nonlabored ventilation, respiratory function stable and patient connected to nasal cannula oxygen Cardiovascular status: blood pressure returned to baseline and stable Postop Assessment: no apparent nausea or vomiting Anesthetic complications: no     Last Vitals:  Vitals:   05/03/17 1208 05/03/17 1236  BP: (!) 122/45 126/81  Pulse: 74 80  Resp: 18 19  Temp: 36.4 C   SpO2: 98% 95%    Last Pain:  Vitals:   05/03/17 1205  TempSrc: Tympanic  PainSc:                  Yolanda Taylor

## 2017-05-03 NOTE — Op Note (Addendum)
[facility] Gastroenterology Patient Name: Yolanda Taylor Procedure Date: 05/03/2017 11:13 AM MRN: [id number] Account #: [id number] Date of Birth: 11-09-1930 Admit Type: Outpatient Age: 84 Room: [facility] ENDO ROOM 1 Gender: Female Note Status: Finalized Procedure:            Upper GI endoscopy Indications:          Gastroparesis, Follow-up of gastroparesis Providers:            Lollie Sails, MD Referring MD:         Dion Body (Referring MD) Medicines:            Monitored Anesthesia Care Complications:        No immediate complications. Procedure:            Pre-Anesthesia Assessment:                       - ASA Grade Assessment: III - A patient with severe                        systemic disease.                       After obtaining informed consent, the endoscope was                        passed under direct vision. Throughout the procedure,                        the patient's blood pressure, pulse, and oxygen                        saturations were monitored continuously. The Endoscope                        was introduced through the mouth, and advanced to the                        antrum of the stomach. The patient tolerated the                        procedure well. Findings:      A small amount of food (residue) was found on the greater curvature of       the gastric body. There was no evidence of bezoar, as seen previously.       The scope was not passed into the small intestine due to aspiration risk       of noted contents.      The examined surface of the gastric vault that could be visualized was       normal in appearance.      A small hiatal hernia was present.      Patchy candidiasis was found in the upper third of the esophagus, in the       middle third of the esophagus and in the lower third of the esophagus.      The exam of the esophagus was otherwise normal. Impression:           - Normal esophagus.                       -  A small amount of food (residue) in the stomach.                       -  No specimens collected. Recommendation:       - Use Protonix (pantoprazole) 40 mg PO BID daily.                       - Low residue diet for the rest of the patient's life.                       - Mycelex (clotrimazole) 10 mg lozenge 5x/day for 5                        days. Procedure Code(s):    --- Professional ---                       [phone], 52, Esophagogastroduodenoscopy, flexible,                        transoral; diagnostic, including collection of                        specimen(s) by brushing or washing, when performed                        (separate procedure) Diagnosis Code(s):    --- Professional ---                       K31.84, Gastroparesis CPT copyright 2016 American [employer]. All rights reserved. The codes documented in this report are preliminary and upon coder review may  be revised to meet current compliance requirements. Lollie Sails, MD 05/03/2017 12:14:59 PM This report has been signed electronically. Number of Addenda: 0 Note Initiated On: 05/03/2017 11:13 AM      [facility]

## 2017-05-03 NOTE — Anesthesia Preprocedure Evaluation (Signed)
Anesthesia Evaluation  Patient identified by MRN, date of birth, ID band Patient awake    Reviewed: Allergy & Precautions, NPO status , Patient's Chart, lab work & pertinent test results, reviewed documented beta blocker date and time   History of Anesthesia Complications (+) PONV and history of anesthetic complications  Airway Mallampati: II  TM Distance: >3 FB     Dental  (+) Chipped   Pulmonary COPD, former smoker,           Cardiovascular hypertension, Pt. on medications      Neuro/Psych Anxiety TIA   GI/Hepatic hiatal hernia, GERD  ,  Endo/Other    Renal/GU Renal disease     Musculoskeletal  (+) Arthritis ,   Abdominal   Peds  Hematology   Anesthesia Other Findings Neck movement ok. Nissen fund. 94% sats.  Reproductive/Obstetrics                             Anesthesia Physical Anesthesia Plan  ASA: III  Anesthesia Plan: General   Post-op Pain Management:    Induction: Intravenous  PONV Risk Score and Plan:   Airway Management Planned: Oral ETT  Additional Equipment:   Intra-op Plan:   Post-operative Plan:   Informed Consent: I have reviewed the patients History and Physical, chart, labs and discussed the procedure including the risks, benefits and alternatives for the proposed anesthesia with the patient or authorized representative who has indicated his/her understanding and acceptance.     Plan Discussed with: CRNA  Anesthesia Plan Comments:         Anesthesia Quick Evaluation

## 2017-05-03 NOTE — H&P (Signed)
Outpatient short stay form Pre-procedure 05/03/2017 11:31 AM Yolanda Sails MD  Primary Physician: Dr Dion Body  Reason for visit: EGD  History of present illness: Patient is a 84 year old female with a history of poor gastric emptying.  She had an EGD on 04/11/2017 that showed a large volume of food in the stomach likely compacted as a bezoar.  She has had a bezoar in the past that necessitated removal from the stomach.  Since that date she has been on a higher dose of metoclopramide, which she has tolerated well, and a strict low residue diet.      Current Facility-Administered Medications:  .  0.9 %  sodium chloride infusion, , Intravenous, Continuous, Yolanda Sails, MD  Medications Prior to Admission  Medication Sig Dispense Refill Last Dose  . acetaminophen (TYLENOL) 500 MG tablet Take 500-1,000 mg by mouth every 6 (six) hours as needed for moderate pain or headache.    Past Week at Unknown time  . albuterol (PROVENTIL HFA;VENTOLIN HFA) 108 (90 Base) MCG/ACT inhaler Inhale 2 puffs into the lungs every 4 (four) hours as needed for wheezing or shortness of breath. 1 Inhaler 0 05/02/2017 at 1000  . amLODipine (NORVASC) 2.5 MG tablet Take 2.5 mg by mouth daily.    05/02/2017 at Unknown time  . calcium-vitamin D (OSCAL WITH D) 500-200 MG-UNIT tablet Take 2 tablets by mouth daily with breakfast.   05/02/2017 at Unknown time  . diazepam (VALIUM) 5 MG tablet Take 1 tablet (5 mg total) by mouth every 8 (eight) hours as needed for anxiety or muscle spasms. 30 tablet 0 05/02/2017 at Unknown time  . ferrous sulfate 325 (65 FE) MG tablet Take 1 tablet (325 mg total) by mouth 2 (two) times daily with a meal. 60 tablet 0 05/02/2017 at Unknown time  . Fluticasone-Salmeterol (ADVAIR) 250-50 MCG/DOSE AEPB Inhale 1 puff into the lungs 2 (two) times daily.   05/02/2017 at Unknown time  . hydrochlorothiazide (HYDRODIURIL) 25 MG tablet Take 25 mg by mouth daily.    05/02/2017 at Unknown time  .  loratadine (CLARITIN) 10 MG tablet Take 10 mg by mouth daily as needed for allergies.   05/02/2017 at Unknown time  . meclizine (ANTIVERT) 25 MG tablet Take 25 mg by mouth 3 (three) times daily as needed for dizziness.   05/02/2017 at Unknown time  . metoCLOPramide (REGLAN) 5 MG tablet Take 5 mg by mouth 4 (four) times daily -  before meals and at bedtime.    05/02/2017 at Unknown time  . pantoprazole (PROTONIX) 40 MG tablet Take 40 mg by mouth daily.    05/02/2017 at Unknown time  . Polyethyl Glycol-Propyl Glycol (SYSTANE) 0.4-0.3 % SOLN Place 1 drop into both eyes as needed.   05/02/2017 at Unknown time  . sucralfate (CARAFATE) 1 g tablet Take 1 g by mouth 2 (two) times daily.   05/02/2017 at Unknown time  . traMADol (ULTRAM) 50 MG tablet Take 50 mg by mouth every 4 (four) hours as needed. For pain level 1 - 6   05/02/2017 at Unknown time  . Vitamin D, Ergocalciferol, (DRISDOL) 50000 UNITS CAPS capsule Take 50,000 Units by mouth every Monday.    05/02/2017 at Unknown time  . enoxaparin (LOVENOX) 40 MG/0.4ML injection Inject 0.4 mLs (40 mg total) into the skin daily. (Patient not taking: Reported on 05/03/2017) 14 Syringe 0 Completed Course at Unknown time  . oxyCODONE 10 MG TABS Take 1 tablet (10 mg total) by mouth every 3 (  three) hours as needed for severe pain ((score 7 to 10)). (Patient not taking: Reported on 05/03/2017) 30 tablet 0 Completed Course at Unknown time     Allergies  Allergen Reactions  . Buspar [Buspirone]   . Fluoxetine Hcl   . Lexapro [Escitalopram Oxalate]   . Paroxetine Hcl Other (See Comments)    "Woozie"  . Venlafaxine Hcl   . Zoloft [Sertraline Hcl]      Past Medical History:  Diagnosis Date  . Anxiety    unspecified  . Arthritis   . Borderline diabetes   . Breast cancer ([place]) 2006   right breast, radiation  . Cancer ([place])   . COPD (chronic obstructive pulmonary disease) ([place])   . Emphysema lung ([place])   . Essential hypertension   . Gastroparesis   . GERD  (gastroesophageal reflux disease)   . Hemorrhoid   . History of anesthesia reaction    pain with procedure 03/08/2014; very sedated after surgery  . History of hiatal hernia   . History of kidney stones   . Hypertension   . Nephrolithiasis   . Plantar fascial fibromatosis   . PONV (postoperative nausea and vomiting)    unspecified; after surgery 03/08/2014  . Pre-diabetes   . Pure hypercholesterolemia   . TIA (transient ischemic attack)   . Vertigo   . Vitamin D deficiency, unspecified     Review of systems:      Physical Exam    Heart and lungs: Regular rate and rhythm without rub or gallop, lungs are bilaterally clear.    HEENT: Normocephalic atraumatic eyes are anicteric    Other:    Pertinant exam for procedure: Soft nontender nondistended bowel sounds positive normoactive    Planned proceedures: EGD and indicated procedures.  Discussed the risk benefits complications to include not limited to bleeding infection perforation and the risk of sedation and she wishes to proceed.  Due to the nature of this procedure we will be taken an initial look under monitored anesthesia.  Should there be a large bezoar in place which will require manipulation and/or removal we will proceed with removing the scope, intubating her for airway protection and then reintroducing the scope.  Has been explained in detail to patient and her family member.  Is also going to receive some IV antibiotics today due to her recent history of a total shoulder replacement.    Yolanda Sails, MD Gastroenterology 05/03/2017  11:31 AM

## 2017-05-03 NOTE — Anesthesia Post-op Follow-up Note (Signed)
Anesthesia QCDR form completed.        

## 2017-05-03 NOTE — Transfer of Care (Signed)
Immediate Anesthesia Transfer of Care Note  Patient: Yolanda Taylor  Procedure(s) Performed: ESOPHAGOGASTRODUODENOSCOPY (EGD) WITH PROPOFOL (N/A )  Patient Location: PACU and Endoscopy Unit  Anesthesia Type:General  Level of Consciousness: awake and patient cooperative  Airway & Oxygen Therapy: Patient Spontanous Breathing and Patient connected to nasal cannula oxygen  Post-op Assessment: Report given to RN and Post -op Vital signs reviewed and stable  Post vital signs: Reviewed and stable  Last Vitals:  Vitals:   05/03/17 1115  BP: 138/64  Pulse: 80  Resp: 18  Temp: (!) 36.3 C  SpO2: 97%    Last Pain:  Vitals:   05/03/17 1115  TempSrc: Tympanic  PainSc: 0-No pain      Patients Stated Pain Goal: 5 (64/38/38 1840)  Complications: No apparent anesthesia complications

## 2017-05-06 ENCOUNTER — Encounter: Payer: Self-pay | Admitting: Gastroenterology

## 2017-06-04 IMAGING — NM NM BONE 3 PHASE
2 series · 12 of 12 positions shown · non-contrast
Comparison: Plain film 10/11/2014

CLINICAL DATA: An oblique fracture of the LEFT femur with internal
fixation.

EXAM:
NUCLEAR MEDICINE 3-PHASE BONE SCAN
TECHNIQUE: Radionuclide angiographic images, immediate static blood pool
images, and 3-hour delayed static images were obtained of the femurs
after intravenous injection of radiopharmaceutical.
RADIOPHARMACEUTICALS:  21.4 mCi Oc-FFm MDP

[Series 1000: bone statics · 1.65mm/px · 3 acquisitions, 6 frames shown]
[im 1/3]
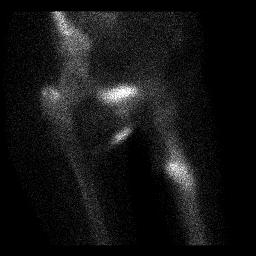
[im 1/3]
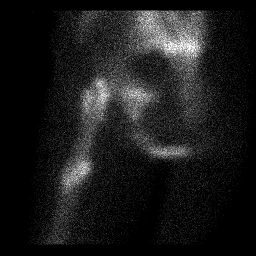
[im 2/3  full-range]
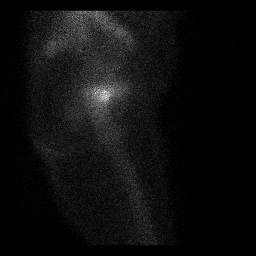
[im 2/3  full-range]
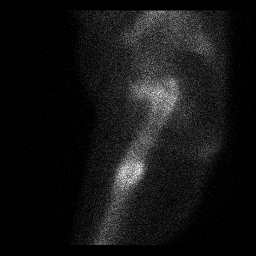
[im 3/3]
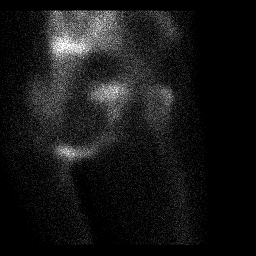
[im 3/3]
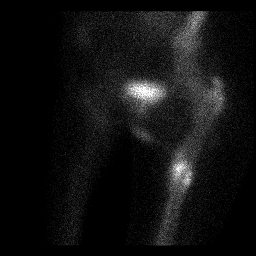

[Series 1000: flow · 4.80mm/px · 6 of 60 frames shown]
[frame 6/60  full-range]
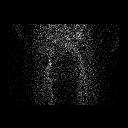
[frame 16/60  full-range]
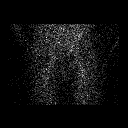
[frame 26/60  full-range]
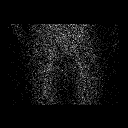
[frame 36/60  full-range]
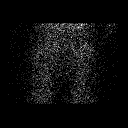
[frame 46/60  full-range]
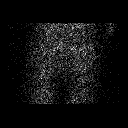
[frame 56/60  full-range]
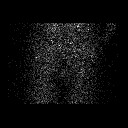

[12 of 12 positions shown; findings below may reference images not displayed]

FINDINGS: Vascular phase: No asymmetric or increased blood flow to the LEFT or
RIGHT hips.

Blood pool phase: No abnormal blood pool activity in the hips.

Delayed phase: Intense uptake in the midshaft LEFT femur at the site
of the fracture. Mild uptake at the site of the greater trochanter
on the LEFT.
IMPRESSION: Intense uptake along the fracture line of the LEFT femoral diaphysis
consistent with healing fracture. Cannot exclude nonunion. Consider
plain films.

## 2017-08-01 ENCOUNTER — Other Ambulatory Visit: Payer: Self-pay | Admitting: Family Medicine

## 2017-08-01 DIAGNOSIS — Z1231 Encounter for screening mammogram for malignant neoplasm of breast: Secondary | ICD-10-CM

## 2017-09-04 ENCOUNTER — Ambulatory Visit
Admission: RE | Admit: 2017-09-04 | Discharge: 2017-09-04 | Disposition: A | Discharge status: Home / Self Care | Payer: Medicare Other | Source: Ambulatory Visit | Attending: Family Medicine | Admitting: Family Medicine

## 2017-09-04 DIAGNOSIS — Z1231 Encounter for screening mammogram for malignant neoplasm of breast: Secondary | ICD-10-CM | POA: Insufficient documentation

## 2017-10-26 ENCOUNTER — Other Ambulatory Visit: Payer: Self-pay

## 2017-10-26 ENCOUNTER — Emergency Department: Payer: Medicare Other

## 2017-10-26 ENCOUNTER — Encounter: Payer: Self-pay | Admitting: Emergency Medicine

## 2017-10-26 ENCOUNTER — Emergency Department
Admission: EM | Admit: 2017-10-26 | Discharge: 2017-10-26 | Disposition: A | Discharge status: Home / Self Care | Payer: Medicare Other | Attending: Student in an Organized Health Care Education/Training Program | Admitting: Student in an Organized Health Care Education/Training Program

## 2017-10-26 DIAGNOSIS — R0602 Shortness of breath: Secondary | ICD-10-CM | POA: Insufficient documentation

## 2017-10-26 DIAGNOSIS — Z79899 Other long term (current) drug therapy: Secondary | ICD-10-CM | POA: Diagnosis not present

## 2017-10-26 DIAGNOSIS — Z87891 Personal history of nicotine dependence: Secondary | ICD-10-CM | POA: Diagnosis not present

## 2017-10-26 DIAGNOSIS — I10 Essential (primary) hypertension: Secondary | ICD-10-CM | POA: Insufficient documentation

## 2017-10-26 DIAGNOSIS — J449 Chronic obstructive pulmonary disease, unspecified: Secondary | ICD-10-CM | POA: Insufficient documentation

## 2017-10-26 LAB — CBC WITH DIFFERENTIAL/PLATELET
BASOS PCT: 1 %
Basophils Absolute: 0.1 10*3/uL (ref 0–0.1)
EOS PCT: 1 %
Eosinophils Absolute: 0.1 10*3/uL (ref 0–0.7)
HCT: 41.1 % (ref 35.0–47.0)
Hemoglobin: 14.1 g/dL (ref 12.0–16.0)
Lymphocytes Relative: 20 %
Lymphs Abs: 2 10*3/uL (ref 1.0–3.6)
MCH: 31 pg (ref 26.0–34.0)
MCHC: 34.2 g/dL (ref 32.0–36.0)
MCV: 90.6 fL (ref 80.0–100.0)
Monocytes Absolute: 0.7 10*3/uL (ref 0.2–0.9)
Monocytes Relative: 7 %
NEUTROS PCT: 71 %
Neutro Abs: 7.2 10*3/uL — ABNORMAL HIGH (ref 1.4–6.5)
PLATELETS: 268 10*3/uL (ref 150–440)
RBC: 4.53 MIL/uL (ref 3.80–5.20)
RDW: 13.6 % (ref 11.5–14.5)
WBC: 10.1 10*3/uL (ref 3.6–11.0)

## 2017-10-26 LAB — BASIC METABOLIC PANEL
Anion gap: 8 (ref 5–15)
BUN: 20 mg/dL (ref 8–23)
CO2: 29 mmol/L (ref 22–32)
CREATININE: 0.83 mg/dL (ref 0.44–1.00)
Calcium: 9.8 mg/dL (ref 8.9–10.3)
Chloride: 104 mmol/L (ref 98–111)
Glucose, Bld: 110 mg/dL — ABNORMAL HIGH (ref 70–99)
POTASSIUM: 3.4 mmol/L — AB (ref 3.5–5.1)
SODIUM: 141 mmol/L (ref 135–145)

## 2017-10-26 LAB — TROPONIN I: Troponin I: <0.03 ng/mL (ref <0.03)

## 2017-10-26 MED ORDER — FLUTICASONE-SALMETEROL 250-50 MCG/DOSE IN AEPB: 1.0000 | INHALATION_SPRAY | Freq: Two times a day (BID) | RESPIRATORY_TRACT | 2 refills | Status: DC

## 2017-10-26 MED ORDER — ALBUTEROL SULFATE HFA 108 (90 BASE) MCG/ACT IN AERS: 2.0000 | INHALATION_SPRAY | RESPIRATORY_TRACT | 0 refills | Status: AC | PRN

## 2017-10-26 MED ORDER — IPRATROPIUM-ALBUTEROL 0.5-2.5 (3) MG/3ML IN SOLN
3.0000 mL | Freq: Once | RESPIRATORY_TRACT | Status: AC
  Administered 2017-10-26: 3 mL via RESPIRATORY_TRACT
  Filled 2017-10-26: qty 3

## 2017-10-26 MED ORDER — FLUTICASONE-SALMETEROL 250-50 MCG/DOSE IN AEPB: 1.0000 | INHALATION_SPRAY | Freq: Two times a day (BID) | RESPIRATORY_TRACT | 2 refills | Status: AC

## 2017-10-26 NOTE — ED Triage Notes (Signed)
Pt to ED via POV c/o shortness of breath and dizziness. Pt states that she has hx/o COPD, pt states that for the past 2 months she has been sitting in the recliner, pt uses 2 inhalers. Pt states that today when she used her rescue inhaler it made her very dizzy and she did not feel like she was going to be able to walk back to the chair. Pt is ambulatory to triage. Pt is able to talk in complete sentences at this time, in NAD.

## 2017-10-26 NOTE — ED Notes (Addendum)
Pt ambulatory with steady gait to stat registration to let us know a patient sitting near her is sick and needs a blanket; pt then walked over to the lady she's speaking about to let her know she had asked for a blanket;

## 2017-10-26 NOTE — ED Provider Notes (Signed)
[facility] Emergency Department Provider Note    First MD Initiated Contact with Patient 10/26/17 2145     (approximate)  I have reviewed the triage vital signs and the nursing notes.   HISTORY  Chief Complaint Shortness of Breath    HPI Yolanda Taylor is a 84 y.o. female presents with multiple complaints today primarily feeling some shortness of breath on and off for the past several days.  Says she has a history of COPD but is not been using her controller medicine only using it when needed but states that she does not feel like she gets any improvement with the Symbicort.  States that she only gets any relief from albuterol.  Denies any cough or recent fevers.  Denies any chest pain palpitations.  No nausea or vomiting.  Speaking complete sentences    Past Medical History:  Diagnosis Date  . Anxiety    unspecified  . Arthritis   . Borderline diabetes   . Breast cancer ([place]) 2006   right breast, radiation  . Cancer ([place])   . COPD (chronic obstructive pulmonary disease) ([place])   . Emphysema lung ([place])   . Essential hypertension   . Gastroparesis   . GERD (gastroesophageal reflux disease)   . Hemorrhoid   . History of anesthesia reaction    pain with procedure 03/08/2014; very sedated after surgery  . History of hiatal hernia   . History of kidney stones   . Hypertension   . Nephrolithiasis   . Plantar fascial fibromatosis   . PONV (postoperative nausea and vomiting)    unspecified; after surgery 03/08/2014  . Pre-diabetes   . Pure hypercholesterolemia   . TIA (transient ischemic attack)   . Vertigo   . Vitamin D deficiency, unspecified    Family History  Problem Relation Age of Onset  . Breast cancer Paternal Aunt   . Nephrolithiasis Mother   . Heart attack Father   . Coronary artery disease Sister   . Cancer Brother   . Heart attack Brother   . Glaucoma Neg Hx   . Macular degeneration Neg Hx    Past Surgical History:    Procedure Laterality Date  . ABDOMINAL HYSTERECTOMY    . ANTERIOR FUSION CERVICAL SPINE    . BREAST EXCISIONAL BIOPSY Left 2001   neg  . BREAST EXCISIONAL BIOPSY Right 2006   positive, lumpectomy  . BREAST SURGERY    . CHOLECYSTECTOMY    . COLONOSCOPY     04/04/1998; 08/23/2000; 08/19/2003; 02/24/2009; 12/18/2012  . COLONOSCOPY    . ESOPHAGOGASTRODUODENOSCOPY (EGD) WITH PROPOFOL N/A 04/11/2017   Procedure: ESOPHAGOGASTRODUODENOSCOPY (EGD) WITH PROPOFOL;  Surgeon: Lollie Sails, MD;  Location: [facility] ENDOSCOPY;  Service: Endoscopy;  Laterality: N/A;  . ESOPHAGOGASTRODUODENOSCOPY (EGD) WITH PROPOFOL N/A 05/03/2017   Procedure: ESOPHAGOGASTRODUODENOSCOPY (EGD) WITH PROPOFOL;  Surgeon: Lollie Sails, MD;  Location: [facility] ENDOSCOPY;  Service: Endoscopy;  Laterality: N/A;  . EYE SURGERY Right 03/02/2014   Procedure: REMOVAL OF FOREIGN BODY, INTRAOCULAR; FROM POSTERIOR SEGMENT, NONMAGNETIC EXTRACTION (IOL); Surgeon: Dorna Mai, MD; Location: [place]; Service: Ophthalmology; Laterality: Right;  . HERNIA REPAIR    . HIATAL HERNIA REPAIR  2014   Laproscopic hiatal hernia repair with fundoplication ; Dr. Tamala Julian  . HIP FRACTURE SURGERY Left 09/2014  . INTRAMEDULLARY (IM) NAIL INTERTROCHANTERIC Left 10/11/2014   Procedure: intermedullary nailing of left subtrocanteric  femur  fracture;  Surgeon: Earnestine Leys, MD;  Location: [facility] ORS;  Service: Orthopedics;  Laterality:  Left;  . INTRAOCULAR LENS EXCHANGE Right 03/29/2014   Procedure: EXCHANGE INTRAOCULAR LENS; Surgeon: Ross Ludwig, MD; Location: [place]; Service: Ophthalmology; Laterality: Right;  . NISSEN FUNDOPLICATION    . POLYPECTOMY  2000  . REPOSITIONING INTRAOCULAR LENS Right 03/02/2014   Procedure: REPOSITIONING INTRAOCULAR LENS; Surgeon: Ross Ludwig, MD; Location: [place]; Service: Ophthalmology; Laterality: Right;  . REPOSITIONING INTRAOCULAR LENS Right 03/08/2014   Procedure: REPOSITIONING INTRAOCULAR  LENS; Surgeon: Ross Ludwig, MD; Location: [place]; Service: Ophthalmology; Laterality: Right; pt had surgery yesterday needs to have lens repositioned come from a long way away NAME ALERT  . SHOULDER OPEN ROTATOR CUFF REPAIR Right 01/10/2017   Procedure: ROTATOR CUFF REPAIR SHOULDER OPEN;  Surgeon: Corky Mull, MD;  Location: [facility] ORS;  Service: Orthopedics;  Laterality: Right;  . TOTAL ABDOMINAL HYSTERECTOMY W/ BILATERAL SALPINGOOPHORECTOMY  1976  . TOTAL SHOULDER ARTHROPLASTY Right 01/10/2017   Procedure: TOTAL SHOULDER ARTHROPLASTY;  Surgeon: Corky Mull, MD;  Location: [facility] ORS;  Service: Orthopedics;  Laterality: Right;  . UPPER GASTROINTESTINAL ENDOSCOPY  04/11/2012  . UPPER GASTROINTESTINAL ENDOSCOPY  07/25/2012  . UPPER GI ENDOSCOPY     Patient Active Problem List   Diagnosis Date Noted  . Primary osteoarthritis of right shoulder 02/07/2017  . Status post total shoulder arthroplasty, right 01/10/2017  . Femoral condyle fracture ([place]) 10/11/2014      Prior to Admission medications   Medication Sig Start Date End Date Taking? Authorizing Provider  acetaminophen (TYLENOL) 500 MG tablet Take 500-1,000 mg by mouth every 6 (six) hours as needed for moderate pain or headache.     [provider]  albuterol (PROVENTIL HFA;VENTOLIN HFA) 108 (90 Base) MCG/ACT inhaler Inhale 2 puffs into the lungs every 4 (four) hours as needed for wheezing or shortness of breath. 05/07/16   Paulette Blanch, MD  amLODipine (NORVASC) 2.5 MG tablet Take 2.5 mg by mouth daily.     [provider]  calcium-vitamin D (OSCAL WITH D) 500-200 MG-UNIT tablet Take 2 tablets by mouth daily with breakfast.    [provider]  diazepam (VALIUM) 5 MG tablet Take 1 tablet (5 mg total) by mouth every 8 (eight) hours as needed for anxiety or muscle spasms. 10/14/14   Epifanio Lesches, MD  enoxaparin (LOVENOX) 40 MG/0.4ML injection Inject 0.4 mLs (40 mg total) into the skin  daily. Patient not taking: Reported on 05/03/2017 01/11/17   Watt Climes, PA  ferrous sulfate 325 (65 FE) MG tablet Take 1 tablet (325 mg total) by mouth 2 (two) times daily with a meal. 10/14/14   Epifanio Lesches, MD  Fluticasone-Salmeterol (ADVAIR) 250-50 MCG/DOSE AEPB Inhale 1 puff into the lungs 2 (two) times daily.    [provider]  hydrochlorothiazide (HYDRODIURIL) 25 MG tablet Take 25 mg by mouth daily.  09/27/14   [provider]  loratadine (CLARITIN) 10 MG tablet Take 10 mg by mouth daily as needed for allergies.    [provider]  meclizine (ANTIVERT) 25 MG tablet Take 25 mg by mouth 3 (three) times daily as needed for dizziness.    [provider]  metoCLOPramide (REGLAN) 5 MG tablet Take 5 mg by mouth 4 (four) times daily -  before meals and at bedtime.     [provider]  oxyCODONE 10 MG TABS Take 1 tablet (10 mg total) by mouth every 3 (three) hours as needed for severe pain ((score 7 to 10)). Patient not taking: Reported on  05/03/2017 01/11/17   Watt Climes, PA  pantoprazole (PROTONIX) 40 MG tablet Take 40 mg by mouth daily.  09/10/14   [provider]  Polyethyl Glycol-Propyl Glycol (SYSTANE) 0.4-0.3 % SOLN Place 1 drop into both eyes as needed.    [provider]  sucralfate (CARAFATE) 1 g tablet Take 1 g by mouth 2 (two) times daily.    [provider]  traMADol (ULTRAM) 50 MG tablet Take 50 mg by mouth every 4 (four) hours as needed. For pain level 1 - 6    [provider]  Vitamin D, Ergocalciferol, (DRISDOL) 50000 UNITS CAPS capsule Take 50,000 Units by mouth every Monday.  08/17/14   [provider]    Allergies Buspar [buspirone]; Fluoxetine hcl; Lexapro [escitalopram oxalate]; Paroxetine hcl; Venlafaxine hcl; and Zoloft [sertraline hcl]    Social History Social History   Tobacco Use  . Smoking status: Former Smoker    Packs/day: 1.00    Years: 17.00    Pack years: 17.00     Types: Cigarettes    Last attempt to quit: 03/19/1969    Years since quitting: 48.6  . Smokeless tobacco: Never Used  Substance Use Topics  . Alcohol use: No  . Drug use: No    Review of Systems Patient denies headaches, rhinorrhea, blurry vision, numbness, shortness of breath, chest pain, edema, cough, abdominal pain, nausea, vomiting, diarrhea, dysuria, fevers, rashes or hallucinations unless otherwise stated above in HPI. ____________________________________________   PHYSICAL EXAM:  VITAL SIGNS: Vitals:   10/26/17 1648 10/26/17 1918  BP: (!) 146/69 (!) 159/72  Pulse: 85 78  Resp: 16 20  Temp: 97.6 F (36.4 C) 97.8 F (36.6 C)  SpO2: 94% 97%    Constitutional: Alert and oriented.  Eyes: Conjunctivae are normal.  Head: Atraumatic. Nose: No congestion/rhinnorhea. Mouth/Throat: Mucous membranes are moist.   Neck: No stridor. Painless ROM.  Cardiovascular: Normal rate, regular rhythm. Grossly normal heart sounds.  Good peripheral circulation. Respiratory: Normal respiratory effort.  No retractions. Lungs with faint wheeze, otherwise good airmovement Gastrointestinal: Soft and nontender. No distention. No abdominal bruits. No CVA tenderness. Genitourinary:  Musculoskeletal: No lower extremity tenderness nor edema.  No joint effusions. Neurologic:  Normal speech and language. No gross focal neurologic deficits are appreciated. No facial droop Skin:  Skin is warm, dry and intact. No rash noted. Psychiatric: Mood and affect are normal. Speech and behavior are normal.  ____________________________________________   LABS (all labs ordered are listed, but only abnormal results are displayed)  Results for orders placed or performed during the hospital encounter of 10/26/17 (from the past 24 hour(s))  CBC with Differential     Status: Abnormal   Collection Time: 10/26/17  4:53 PM  Result Value Ref Range   WBC 10.1 3.6 - 11.0 K/uL   RBC 4.53 3.80 - 5.20 MIL/uL    Hemoglobin 14.1 12.0 - 16.0 g/dL   HCT 41.1 35.0 - 47.0 %   MCV 90.6 80.0 - 100.0 fL   MCH 31.0 26.0 - 34.0 pg   MCHC 34.2 32.0 - 36.0 g/dL   RDW 13.6 11.5 - 14.5 %   Platelets 268 150 - 440 K/uL   Neutrophils Relative % 71 %   Neutro Abs 7.2 (H) 1.4 - 6.5 K/uL   Lymphocytes Relative 20 %   Lymphs Abs 2.0 1.0 - 3.6 K/uL   Monocytes Relative 7 %   Monocytes Absolute 0.7 0.2 - 0.9 K/uL   Eosinophils Relative 1 %  Eosinophils Absolute 0.1 0 - 0.7 K/uL   Basophils Relative 1 %   Basophils Absolute 0.1 0 - 0.1 K/uL  Basic metabolic panel     Status: Abnormal   Collection Time: 10/26/17  4:53 PM  Result Value Ref Range   Sodium 141 135 - 145 mmol/L   Potassium 3.4 (L) 3.5 - 5.1 mmol/L   Chloride 104 98 - 111 mmol/L   CO2 29 22 - 32 mmol/L   Glucose, Bld 110 (H) 70 - 99 mg/dL   BUN 20 8 - 23 mg/dL   Creatinine, Ser 0.83 0.44 - 1.00 mg/dL   Calcium 9.8 8.9 - 10.3 mg/dL   GFR calc non Af Amer >60 >60 mL/min   GFR calc Af Amer >60 >60 mL/min   Anion gap 8 5 - 15  Troponin I     Status: None   Collection Time: 10/26/17  4:54 PM  Result Value Ref Range   Troponin I <0.03 <0.03 ng/mL   ____________________________________________  EKG My review and personal interpretation at Time: 16:54   Indication: sob  Rate: 80  Rhythm: sinus Axis: normal Other: normal intervals, no stemi ____________________________________________  RADIOLOGY  I personally reviewed all radiographic images ordered to evaluate for the above acute complaints and reviewed radiology reports and findings.  These findings were personally discussed with the patient.  Please see medical record for radiology report.  ____________________________________________   PROCEDURES  Procedure(s) performed:  Procedures    Critical Care performed: no ____________________________________________   INITIAL IMPRESSION / ASSESSMENT AND PLAN / ED COURSE  Pertinent labs & imaging results that were available during my  care of the patient were reviewed by me and considered in my medical decision making (see chart for details).   DDX: Asthma, copd, CHF, pna, ptx, malignancy, Pe, anemia   Yolanda Taylor is a 84 y.o. who presents to the ED with symptoms as described above.  Denies any chest pain or pressure.  No fevers.  She is ambling with steady gait with no hypoxia.  No tachycardia.  EKG shows no evidence of dysrhythmia or acute ischemia.  Blood work is reassuring.  Chest x-ray shows no evidence of consolidation and chronic COPD.  This not clinically consistent with PE or dissection.  No evidence of anemia.  Do suspect some component of noncompliance to her prescribed home medications.  Will give nebulizer and reassess.  Clinical Course as of Oct 27 2323  Sat Oct 26, 2017  2221 Patient reassessed.  Significant improvement after nebulizer treatment.  Do suspect a large component of her presentation is noncompliance with her controller inhaler.  No evidence of exacerbation no respiratory failure or hypoxia.  No evidence of infectious process.  I do believe patient stable and appropriate for outpatient follow-up.   [PR]    Clinical Course User Index [PR] Merlyn Lot, MD     As part of my medical decision making, I reviewed the following data within the [place] notes reviewed and incorporated, Labs reviewed, notes from prior ED visits.  ____________________________________________   FINAL CLINICAL IMPRESSION(S) / ED DIAGNOSES  Final diagnoses:  Shortness of breath      NEW MEDICATIONS STARTED DURING THIS VISIT:  New Prescriptions   No medications on file     Note:  This document was prepared using Dragon voice recognition software and may include unintentional dictation errors.    Merlyn Lot, MD 10/26/17 2329

## 2017-10-26 NOTE — ED Notes (Signed)
Patient reports feeling more short of breath and stating "I don't feel good".  Patient placed in wheelchair for safety.

## 2017-11-14 ENCOUNTER — Other Ambulatory Visit: Payer: Self-pay

## 2017-11-14 ENCOUNTER — Emergency Department
Admission: EM | Admit: 2017-11-14 | Discharge: 2017-11-14 | Disposition: A | Discharge status: Home / Self Care | Payer: Medicare Other | Attending: Emergency Medicine | Admitting: Emergency Medicine

## 2017-11-14 ENCOUNTER — Encounter: Payer: Self-pay | Admitting: Emergency Medicine

## 2017-11-14 ENCOUNTER — Emergency Department: Payer: Medicare Other

## 2017-11-14 DIAGNOSIS — Z79899 Other long term (current) drug therapy: Secondary | ICD-10-CM | POA: Diagnosis not present

## 2017-11-14 DIAGNOSIS — R0789 Other chest pain: Secondary | ICD-10-CM | POA: Diagnosis not present

## 2017-11-14 DIAGNOSIS — Z853 Personal history of malignant neoplasm of breast: Secondary | ICD-10-CM | POA: Insufficient documentation

## 2017-11-14 DIAGNOSIS — M25511 Pain in right shoulder: Secondary | ICD-10-CM | POA: Insufficient documentation

## 2017-11-14 DIAGNOSIS — R0602 Shortness of breath: Secondary | ICD-10-CM | POA: Diagnosis not present

## 2017-11-14 DIAGNOSIS — M7918 Myalgia, other site: Secondary | ICD-10-CM

## 2017-11-14 DIAGNOSIS — F419 Anxiety disorder, unspecified: Secondary | ICD-10-CM | POA: Insufficient documentation

## 2017-11-14 DIAGNOSIS — Z87891 Personal history of nicotine dependence: Secondary | ICD-10-CM | POA: Diagnosis not present

## 2017-11-14 DIAGNOSIS — I10 Essential (primary) hypertension: Secondary | ICD-10-CM | POA: Diagnosis not present

## 2017-11-14 DIAGNOSIS — M542 Cervicalgia: Secondary | ICD-10-CM | POA: Diagnosis not present

## 2017-11-14 DIAGNOSIS — Z96611 Presence of right artificial shoulder joint: Secondary | ICD-10-CM | POA: Insufficient documentation

## 2017-11-14 DIAGNOSIS — J449 Chronic obstructive pulmonary disease, unspecified: Secondary | ICD-10-CM | POA: Diagnosis not present

## 2017-11-14 LAB — CBC
HEMATOCRIT: 40.7 % (ref 35.0–47.0)
HEMOGLOBIN: 14.2 g/dL (ref 12.0–16.0)
MCH: 31.2 pg (ref 26.0–34.0)
MCHC: 34.9 g/dL (ref 32.0–36.0)
MCV: 89.4 fL (ref 80.0–100.0)
Platelets: 244 10*3/uL (ref 150–440)
RBC: 4.55 MIL/uL (ref 3.80–5.20)
RDW: 13.7 % (ref 11.5–14.5)
WBC: 10.5 10*3/uL (ref 3.6–11.0)

## 2017-11-14 LAB — BASIC METABOLIC PANEL
ANION GAP: 13 (ref 5–15)
BUN: 20 mg/dL (ref 8–23)
CO2: 26 mmol/L (ref 22–32)
Calcium: 9.6 mg/dL (ref 8.9–10.3)
Chloride: 100 mmol/L (ref 98–111)
Creatinine, Ser: 0.7 mg/dL (ref 0.44–1.00)
GFR calc Af Amer: >60 mL/min (ref >60)
Glucose, Bld: 118 mg/dL — ABNORMAL HIGH (ref 70–99)
POTASSIUM: 3 mmol/L — AB (ref 3.5–5.1)
SODIUM: 139 mmol/L (ref 135–145)

## 2017-11-14 LAB — TROPONIN I: Troponin I: <0.03 ng/mL (ref <0.03)

## 2017-11-14 MED ORDER — POTASSIUM CHLORIDE CRYS ER 20 MEQ PO TBCR: 20.0000 meq | EXTENDED_RELEASE_TABLET | Freq: Every day | ORAL | 0 refills | Status: AC

## 2017-11-14 MED ORDER — DOCUSATE SODIUM 100 MG PO CAPS: ORAL_CAPSULE | ORAL | 0 refills | Status: AC

## 2017-11-14 MED ORDER — LIDOCAINE 5 % EX PTCH: 1.0000 | MEDICATED_PATCH | Freq: Two times a day (BID) | CUTANEOUS | 0 refills | Status: AC

## 2017-11-14 MED ORDER — HYDROCODONE-ACETAMINOPHEN 5-325 MG PO TABS: 1.0000 | ORAL_TABLET | ORAL | 0 refills | Status: AC | PRN

## 2017-11-14 MED ORDER — HYDROCODONE-ACETAMINOPHEN 5-325 MG PO TABS
2.0000 | ORAL_TABLET | Freq: Once | ORAL | Status: AC
  Administered 2017-11-14: 2 via ORAL
  Filled 2017-11-14: qty 2

## 2017-11-14 MED ORDER — POTASSIUM CHLORIDE CRYS ER 20 MEQ PO TBCR
40.0000 meq | EXTENDED_RELEASE_TABLET | Freq: Once | ORAL | Status: AC
  Administered 2017-11-14: 40 meq via ORAL
  Filled 2017-11-14: qty 2

## 2017-11-14 MED ORDER — LIDOCAINE 5 % EX PTCH
1.0000 | MEDICATED_PATCH | CUTANEOUS | Status: DC
  Administered 2017-11-14: 1 via TRANSDERMAL
  Filled 2017-11-14: qty 1

## 2017-11-14 NOTE — ED Provider Notes (Signed)
[facility] Emergency Department Provider Note  ____________________________________________   First MD Initiated Contact with Patient 11/14/17 1539     (approximate)  I have reviewed the triage vital signs and the nursing notes.   HISTORY  Chief Complaint Chest Pain    HPI Yolanda Taylor is a 84 y.o. female with extensive chronic medical history as listed below but who primarily visits to the emergency department for evaluation of shortness of breath due to COPD.  She presents today for evaluation of pain in her right shoulder and neck that she feels is radiating to her chest although when she spoke with me directly she only complained about the shoulder pain.  She states that she has had problems with her shoulder for years and had a prior shoulder replacement surgery, but over the last week the shoulder pain is been radiating up into her neck and is causing her great deal of discomfort.  She describes it as sharp and aching, worse when she raises her arm or tries to lift anything, and it makes it difficult for her to rest or find a position of comfort.  She says that nothing in particular makes it better including over-the-counter pain medicines.  She initially did not report any shortness of breath and then as she was becoming increasingly anxious when telling me about her shoulder pain, even becoming slightly tearful, she said that she started to have some shortness of breath but called back down after I reminded her that her lungs are clear.  She initially also denied chest pain but then reported that the pain was rating from her shoulder into her chest.  She denies fever/chills, nausea, vomiting, and abdominal pain.  She reports that the over-the-counter pain medicine is not working for her and she would like something stronger.  She also reports that she has had 2 mini strokes in the past and she wonders if something in her shoulder is going up into her brain  and might cause a stroke or if possibly her brain is not getting enough oxygen.  Past Medical History:  Diagnosis Date  . Anxiety    unspecified  . Arthritis   . Borderline diabetes   . Breast cancer ([place]) 2006   right breast, radiation  . Cancer ([place])   . COPD (chronic obstructive pulmonary disease) ([place])   . Emphysema lung ([place])   . Essential hypertension   . Gastroparesis   . GERD (gastroesophageal reflux disease)   . Hemorrhoid   . History of anesthesia reaction    pain with procedure 03/08/2014; very sedated after surgery  . History of hiatal hernia   . History of kidney stones   . Hypertension   . Nephrolithiasis   . Plantar fascial fibromatosis   . PONV (postoperative nausea and vomiting)    unspecified; after surgery 03/08/2014  . Pre-diabetes   . Pure hypercholesterolemia   . TIA (transient ischemic attack)   . Vertigo   . Vitamin D deficiency, unspecified     Patient Active Problem List   Diagnosis Date Noted  . Primary osteoarthritis of right shoulder 02/07/2017  . Status post total shoulder arthroplasty, right 01/10/2017  . Femoral condyle fracture ([place]) 10/11/2014    Past Surgical History:  Procedure Laterality Date  . ABDOMINAL HYSTERECTOMY    . ANTERIOR FUSION CERVICAL SPINE    . BREAST EXCISIONAL BIOPSY Left 2001   neg  . BREAST EXCISIONAL BIOPSY Right 2006   positive, lumpectomy  . BREAST  SURGERY    . CHOLECYSTECTOMY    . COLONOSCOPY     04/04/1998; 08/23/2000; 08/19/2003; 02/24/2009; 12/18/2012  . COLONOSCOPY    . ESOPHAGOGASTRODUODENOSCOPY (EGD) WITH PROPOFOL N/A 04/11/2017   Procedure: ESOPHAGOGASTRODUODENOSCOPY (EGD) WITH PROPOFOL;  Surgeon: Lollie Sails, MD;  Location: [facility] ENDOSCOPY;  Service: Endoscopy;  Laterality: N/A;  . ESOPHAGOGASTRODUODENOSCOPY (EGD) WITH PROPOFOL N/A 05/03/2017   Procedure: ESOPHAGOGASTRODUODENOSCOPY (EGD) WITH PROPOFOL;  Surgeon: Lollie Sails, MD;  Location: [facility] ENDOSCOPY;  Service: Endoscopy;  Laterality:  N/A;  . EYE SURGERY Right 03/02/2014   Procedure: REMOVAL OF FOREIGN BODY, INTRAOCULAR; FROM POSTERIOR SEGMENT, NONMAGNETIC EXTRACTION (IOL); Surgeon: Dorna Mai, MD; Location: [place]; Service: Ophthalmology; Laterality: Right;  . HERNIA REPAIR    . HIATAL HERNIA REPAIR  2014   Laproscopic hiatal hernia repair with fundoplication ; Dr. Tamala Julian  . HIP FRACTURE SURGERY Left 09/2014  . INTRAMEDULLARY (IM) NAIL INTERTROCHANTERIC Left 10/11/2014   Procedure: intermedullary nailing of left subtrocanteric  femur  fracture;  Surgeon: Earnestine Leys, MD;  Location: [facility] ORS;  Service: Orthopedics;  Laterality: Left;  . INTRAOCULAR LENS EXCHANGE Right 03/29/2014   Procedure: EXCHANGE INTRAOCULAR LENS; Surgeon: Ross Ludwig, MD; Location: [place]; Service: Ophthalmology; Laterality: Right;  . NISSEN FUNDOPLICATION    . POLYPECTOMY  2000  . REPOSITIONING INTRAOCULAR LENS Right 03/02/2014   Procedure: REPOSITIONING INTRAOCULAR LENS; Surgeon: Ross Ludwig, MD; Location: [place]; Service: Ophthalmology; Laterality: Right;  . REPOSITIONING INTRAOCULAR LENS Right 03/08/2014   Procedure: REPOSITIONING INTRAOCULAR LENS; Surgeon: Ross Ludwig, MD; Location: [place]; Service: Ophthalmology; Laterality: Right; pt had surgery yesterday needs to have lens repositioned come from a long way away NAME ALERT  . SHOULDER OPEN ROTATOR CUFF REPAIR Right 01/10/2017   Procedure: ROTATOR CUFF REPAIR SHOULDER OPEN;  Surgeon: Corky Mull, MD;  Location: [facility] ORS;  Service: Orthopedics;  Laterality: Right;  . TOTAL ABDOMINAL HYSTERECTOMY W/ BILATERAL SALPINGOOPHORECTOMY  1976  . TOTAL SHOULDER ARTHROPLASTY Right 01/10/2017   Procedure: TOTAL SHOULDER ARTHROPLASTY;  Surgeon: Corky Mull, MD;  Location: [facility] ORS;  Service: Orthopedics;  Laterality: Right;  . UPPER GASTROINTESTINAL ENDOSCOPY  04/11/2012  . UPPER GASTROINTESTINAL ENDOSCOPY  07/25/2012  . UPPER GI ENDOSCOPY       Prior to Admission medications   Medication Sig Start Date End Date Taking? Authorizing Provider  acetaminophen (TYLENOL) 500 MG tablet Take 500-1,000 mg by mouth every 6 (six) hours as needed for moderate pain or headache.     [provider]  albuterol (PROVENTIL HFA;VENTOLIN HFA) 108 (90 Base) MCG/ACT inhaler Inhale 2 puffs into the lungs every 4 (four) hours as needed for wheezing or shortness of breath. 10/26/17   Merlyn Lot, MD  amLODipine (NORVASC) 2.5 MG tablet Take 2.5 mg by mouth daily.     [provider]  calcium-vitamin D (OSCAL WITH D) 500-200 MG-UNIT tablet Take 2 tablets by mouth daily with breakfast.    [provider]  diazepam (VALIUM) 5 MG tablet Take 1 tablet (5 mg total) by mouth every 8 (eight) hours as needed for anxiety or muscle spasms. 10/14/14   Epifanio Lesches, MD  docusate sodium (COLACE) 100 MG capsule Take 1 tablet once or twice daily as needed for constipation while taking narcotic pain medicine 11/14/17   Hinda Kehr, MD  enoxaparin (LOVENOX) 40 MG/0.4ML injection Inject 0.4 mLs (40 mg total) into the skin daily. Patient not taking: Reported on 05/03/2017 01/11/17   Watt Climes, PA  ferrous sulfate 325 (65 FE) MG tablet Take 1 tablet (325 mg total) by mouth 2 (two) times daily with a meal. 10/14/14   Epifanio Lesches, MD  Fluticasone-Salmeterol (ADVAIR) 250-50 MCG/DOSE AEPB Inhale 1 puff into the lungs 2 (two) times daily. 10/26/17   Merlyn Lot, MD  hydrochlorothiazide (HYDRODIURIL) 25 MG tablet Take 25 mg by mouth daily.  09/27/14   [provider]  HYDROcodone-acetaminophen (NORCO/VICODIN) 5-325 MG tablet Take 1-2 tablets by mouth every 4 (four) hours as needed for moderate pain. 11/14/17   Hinda Kehr, MD  lidocaine (LIDODERM) 5 % Place 1 patch onto the skin every 12 (twelve) hours. Remove & Discard patch within 12 hours or as directed by MD.  [place] the patch off for 12 hours before applying a new one.  11/14/17 11/14/18  Hinda Kehr, MD  loratadine (CLARITIN) 10 MG tablet Take 10 mg by mouth daily as needed for allergies.    [provider]  meclizine (ANTIVERT) 25 MG tablet Take 25 mg by mouth 3 (three) times daily as needed for dizziness.    [provider]  metoCLOPramide (REGLAN) 5 MG tablet Take 5 mg by mouth 4 (four) times daily -  before meals and at bedtime.     [provider]  oxyCODONE 10 MG TABS Take 1 tablet (10 mg total) by mouth every 3 (three) hours as needed for severe pain ((score 7 to 10)). Patient not taking: Reported on 05/03/2017 01/11/17   Watt Climes, PA  pantoprazole (PROTONIX) 40 MG tablet Take 40 mg by mouth daily.  09/10/14   [provider]  Polyethyl Glycol-Propyl Glycol (SYSTANE) 0.4-0.3 % SOLN Place 1 drop into both eyes as needed.    [provider]  potassium chloride SA (KLOR-CON M20) 20 MEQ tablet Take 1 tablet (20 mEq total) by mouth daily. 11/14/17   Hinda Kehr, MD  sucralfate (CARAFATE) 1 g tablet Take 1 g by mouth 2 (two) times daily.    [provider]  traMADol (ULTRAM) 50 MG tablet Take 50 mg by mouth every 4 (four) hours as needed. For pain level 1 - 6    [provider]  Vitamin D, Ergocalciferol, (DRISDOL) 50000 UNITS CAPS capsule Take 50,000 Units by mouth every Monday.  08/17/14   [provider]    Allergies Buspar [buspirone]; Fluoxetine hcl; Lexapro [escitalopram oxalate]; Paroxetine hcl; Venlafaxine hcl; and Zoloft [sertraline hcl]  Family History  Problem Relation Age of Onset  . Breast cancer Paternal Aunt   . Nephrolithiasis Mother   . Heart attack Father   . Coronary artery disease Sister   . Cancer Brother   . Heart attack Brother   . Glaucoma Neg Hx   . Macular degeneration Neg Hx     Social History Social History   Tobacco Use  . Smoking status: Former Smoker    Packs/day: 1.00    Years: 17.00    Pack years: 17.00    Types: Cigarettes    Last  attempt to quit: 03/19/1969    Years since quitting: 48.6  . Smokeless tobacco: Never Used  Substance Use Topics  . Alcohol use: No  . Drug use: No    Review of Systems Constitutional: No fever/chills Eyes: No visual changes. ENT: No sore throat. Cardiovascular: Questionable chest pain versus musculoskeletal pain from the right shoulder radiating into her chest. Respiratory: Variable reports of shortness of breath as described above Gastrointestinal: No abdominal pain.  No nausea, no vomiting.  No diarrhea.  No constipation. Genitourinary: Negative for dysuria. Musculoskeletal: Right shoulder and neck pain as described above Integumentary: Negative for rash. Neurological: Negative for headaches, focal weakness or numbness.   ____________________________________________   PHYSICAL EXAM:  VITAL SIGNS: ED Triage Vitals  Enc Vitals Group     BP 11/14/17 1453 (!) 124/105     Pulse Rate 11/14/17 1453 89     Resp 11/14/17 1453 20     Temp 11/14/17 1453 98.4 F (36.9 C)     Temp Source 11/14/17 1453 Oral     SpO2 11/14/17 1453 98 %     Weight 11/14/17 1457 66.7 kg (147 lb)     Height 11/14/17 1457 1.575 m (5\' 2" )     Head Circumference --      Peak Flow --      Pain Score 11/14/17 1457 6     Pain Loc --      Pain Edu? --      Excl. in [place]? --     Constitutional: Alert and oriented.  Anxious with somewhat labile emotions but not in acute distress and able and willing to laugh and joke with me as well. Eyes: Conjunctivae are normal.  Head: Atraumatic. Nose: No congestion/rhinnorhea. Mouth/Throat: Mucous membranes are moist. Neck: No stridor.  No meningeal signs.   Cardiovascular: Normal rate, regular rhythm. Good peripheral circulation. Grossly normal heart sounds.  Tenderness to palpation throughout the superior anterior chest wall.  Normal radial pulses bilaterally.  Normal capillary refill.  No carotid bruit on either side of the neck. Respiratory: Normal respiratory  effort.  No retractions. Lungs CTAB. Gastrointestinal: Soft and nontender. No distention.  Musculoskeletal: Tenderness to palpation of the right levator scapulae muscle at the insertion point in the shoulder up into the neck.  Reproducible tenderness with range of motion of the shoulder including abduction and extension above her head. Neurologic:  Normal speech and language. No gross focal neurologic deficits are appreciated.  Skin:  Skin is warm, dry and intact. No rash noted. Psychiatric: Mood and affect are normal. Speech and behavior are normal.  ____________________________________________   LABS (all labs ordered are listed, but only abnormal results are displayed)  Labs Reviewed  BASIC METABOLIC PANEL - Abnormal; Notable for the following components:      Result Value   Potassium 3.0 (*)    Glucose, Bld 118 (*)    All other components within normal limits  CBC  TROPONIN I   ____________________________________________  EKG  ED ECG REPORT I, Hinda Kehr, the attending physician, personally viewed and interpreted this ECG.  Date: 11/14/2017 EKG Time: 14: 52 Rate: 93 Rhythm: normal sinus rhythm QRS Axis: LAD Intervals: incomplete RBBB w/ LVH ST/T Wave abnormalities: Non-specific ST segment / T-wave changes, but no evidence of acute ischemia. Narrative Interpretation: no evidence of acute ischemia   ____________________________________________  RADIOLOGY I, Hinda Kehr, personally viewed and evaluated these images (plain radiographs) as part of my medical decision making, as well as reviewing the written report by the radiologist.   ED MD interpretation: No acute findings on chest radiograph.  Official radiology report(s): Dg Chest 2 View  Result Date: 11/14/2017 CLINICAL DATA:  Central chest pain. EXAM: CHEST - 2 VIEW COMPARISON:  10/26/2017 FINDINGS: Cardiomediastinal silhouette is normal. Calcific atherosclerotic disease of the aorta. Mediastinal contours  appear intact. There is no evidence of focal airspace consolidation, or pneumothorax. Small right pleural effusion. Osseous structures are without acute abnormality. Prior right shoulder arthroplasty. Soft tissues are grossly normal. IMPRESSION:  Small right pleural effusion. Calcific atherosclerotic disease of the aorta. Electronically Signed   By: Fidela Salisbury M.D.   On: 11/14/2017 15:54    ____________________________________________   PROCEDURES  Critical Care performed: No   Procedure(s) performed:   Procedures   ____________________________________________   INITIAL IMPRESSION / ASSESSMENT AND PLAN / ED COURSE  As part of my medical decision making, I reviewed the following data within the [place] History obtained from family, Nursing notes reviewed and incorporated, Labs reviewed , EKG interpreted , Old chart reviewed, Radiograph reviewed  and Notes from prior ED visits    Differential diagnosis includes, but is not limited to, musculoskeletal pain/strain, cervical radiculopathy, pinched nerve in the cervical spine, osteoarthritis, less likely carotid dissection or acute neck infection.  ACS is also very unlikely.  The patient's vital signs are within normal limits and she has no signs of acute ischemia on EKG.  No acute findings on chest x-ray.  Basic metabolic panel notable only for slightly decreased potassium of 3.0 which I replenished with potassium 40 mEq by mouth and provided a prescription for a week's worth of supplements.  CBC was within normal limits and troponin was negative.  The patient is quite anxious about her symptoms and the more she talks about her chronic medical history and her individual pains particularly in the right shoulder she becomes upset and almost tearful.  As she was becoming upset she started to report shortness of breath.  I provided reassurance to herself and her daughter and reminded her of all the normal findings that  her lab work and physical exam revealed today.  We talked about the subacute or chronic nature of the neck pain and I pointed out that she is tender in a very specific distribution along the muscles of her neck.  I do not feel that there is any indication for acute or emergent imaging of her head as the symptoms are not at all consistent with CVA.  I recommended we try treatment with Norco 2 tablets by mouth as well as a Lidoderm patch.  Within a short period of time the patient called her nurse back into the room and reported that she felt much better and was ready to go home.  There is no indication for checking a second troponin.  I provided a prescription for Norco as well as a Lidoderm patch.I reviewed the patient's prescription history over the last 24 months in the multi-state controlled substances database(s) that includes [place], [place], [place], [place], [place], [place], [place], [place], [place], [place], [place], [place], [place], [place], [place], [place], [place], [place], [place], [place], [place], [place], [place], [place], [place], [place], [place], [place], [place], [place], [place], [place], [place] PMP, and [place].  Results were notable for multiple prescriptions for pain medicine from last year that appeared to be due to her orthopedic issues, likely her right shoulder.  I gave her a relatively small prescription for Norco and encourage close follow-up both with her primary care doctor and with Dr. Sharlet Salina (physiatry) who might have some additional management recommendations.  I gave my usual and customary return precautions.     ____________________________________________  FINAL CLINICAL IMPRESSION(S) / ED DIAGNOSES  Final diagnoses:  Right shoulder pain, unspecified chronicity  Musculoskeletal pain     MEDICATIONS GIVEN DURING THIS VISIT:  Medications  lidocaine (LIDODERM) 5 % 1  patch (1 patch Transdermal Patch Applied 11/14/17 1602)  potassium chloride SA (  K-DUR,KLOR-CON) CR tablet 40 mEq (has no administration in time range)  HYDROcodone-acetaminophen (NORCO/VICODIN) 5-325 MG per tablet 2 tablet (2 tablets Oral Given 11/14/17 1603)     ED Discharge Orders         Ordered    lidocaine (LIDODERM) 5 %  Every 12 hours     11/14/17 1625    HYDROcodone-acetaminophen (NORCO/VICODIN) 5-325 MG tablet  Every 4 hours PRN     11/14/17 1625    docusate sodium (COLACE) 100 MG capsule     11/14/17 1625    potassium chloride SA (KLOR-CON M20) 20 MEQ tablet  Daily     11/14/17 1627           Note:  This document was prepared using Dragon voice recognition software and may include unintentional dictation errors.    Hinda Kehr, MD 11/14/17 [id number]

## 2017-11-14 NOTE — Discharge Instructions (Signed)

## 2017-11-14 NOTE — ED Triage Notes (Signed)
Pt to ED from home c/o center chest pain radiating to right shoulder, arm, and neck started Saturday progressively getting worse.  States light headed when walking.

## 2017-11-14 NOTE — ED Notes (Signed)
Patient is complaining of right shoulder pain that is very severe as well as intermittent chest pain.  Patient denies any recent injuries.

## 2018-07-28 ENCOUNTER — Other Ambulatory Visit: Payer: Self-pay | Admitting: Family Medicine

## 2018-07-28 DIAGNOSIS — Z1231 Encounter for screening mammogram for malignant neoplasm of breast: Secondary | ICD-10-CM

## 2018-10-10 ENCOUNTER — Other Ambulatory Visit: Payer: Self-pay | Admitting: Gastroenterology

## 2018-10-10 DIAGNOSIS — R112 Nausea with vomiting, unspecified: Secondary | ICD-10-CM

## 2018-10-10 DIAGNOSIS — R1013 Epigastric pain: Secondary | ICD-10-CM

## 2018-10-10 DIAGNOSIS — R1084 Generalized abdominal pain: Secondary | ICD-10-CM

## 2018-10-15 ENCOUNTER — Ambulatory Visit
Admission: RE | Admit: 2018-10-15 | Discharge: 2018-10-15 | Disposition: A | Discharge status: Home / Self Care | Payer: Medicare Other | Source: Ambulatory Visit | Attending: Gastroenterology | Admitting: Gastroenterology

## 2018-10-15 ENCOUNTER — Other Ambulatory Visit: Payer: Self-pay

## 2018-10-15 DIAGNOSIS — R112 Nausea with vomiting, unspecified: Secondary | ICD-10-CM | POA: Insufficient documentation

## 2018-10-15 DIAGNOSIS — R1013 Epigastric pain: Secondary | ICD-10-CM | POA: Insufficient documentation

## 2018-10-15 DIAGNOSIS — R1084 Generalized abdominal pain: Secondary | ICD-10-CM | POA: Diagnosis present

## 2018-10-15 MED ORDER — IOHEXOL 300 MG/ML  SOLN
100.0000 mL | Freq: Once | INTRAMUSCULAR | Status: AC | PRN
  Administered 2018-10-15: 100 mL via INTRAVENOUS

## 2019-12-23 ENCOUNTER — Other Ambulatory Visit: Payer: Self-pay | Admitting: Physician Assistant

## 2019-12-23 ENCOUNTER — Ambulatory Visit
Admission: RE | Admit: 2019-12-23 | Discharge: 2019-12-23 | Disposition: A | Discharge status: Home / Self Care | Payer: Medicare Other | Source: Ambulatory Visit | Attending: Physician Assistant | Admitting: Physician Assistant

## 2019-12-23 ENCOUNTER — Other Ambulatory Visit
Admission: RE | Admit: 2019-12-23 | Discharge: 2019-12-23 | Disposition: A | Discharge status: Home / Self Care | Payer: Medicare Other | Source: Ambulatory Visit | Attending: Physician Assistant | Admitting: Physician Assistant

## 2019-12-23 ENCOUNTER — Other Ambulatory Visit: Payer: Self-pay

## 2019-12-23 DIAGNOSIS — R0602 Shortness of breath: Secondary | ICD-10-CM | POA: Insufficient documentation

## 2019-12-23 LAB — TROPONIN I (HIGH SENSITIVITY): Troponin I (High Sensitivity): 7 ng/L (ref <18)

## 2019-12-23 MED ORDER — IOHEXOL 350 MG/ML SOLN
75.0000 mL | Freq: Once | INTRAVENOUS | Status: AC | PRN
  Administered 2019-12-23: 75 mL via INTRAVENOUS

## 2020-01-07 ENCOUNTER — Other Ambulatory Visit: Payer: Self-pay | Admitting: Gastroenterology

## 2020-01-07 DIAGNOSIS — R1010 Upper abdominal pain, unspecified: Secondary | ICD-10-CM

## 2020-01-13 ENCOUNTER — Other Ambulatory Visit: Payer: Self-pay

## 2020-01-13 ENCOUNTER — Other Ambulatory Visit: Payer: Self-pay | Admitting: Gastroenterology

## 2020-01-13 ENCOUNTER — Ambulatory Visit
Admission: RE | Admit: 2020-01-13 | Discharge: 2020-01-13 | Disposition: A | Discharge status: Home / Self Care | Payer: Medicare Other | Source: Ambulatory Visit | Attending: Gastroenterology | Admitting: Gastroenterology

## 2020-01-13 DIAGNOSIS — R1010 Upper abdominal pain, unspecified: Secondary | ICD-10-CM

## 2020-01-14 ENCOUNTER — Ambulatory Visit: Payer: Medicare Other

## 2020-01-23 IMAGING — CR DG CHEST 2V
2 series · 2 of 2 positions shown · non-contrast
Comparison: Chest radiographs and chest CTA dated 04/20/2017.

CLINICAL DATA: Dizziness and shortness of breath. History of breast
cancer and COPD. Ex-smoker.

EXAM:
CHEST - 2 VIEW

[chest pa]
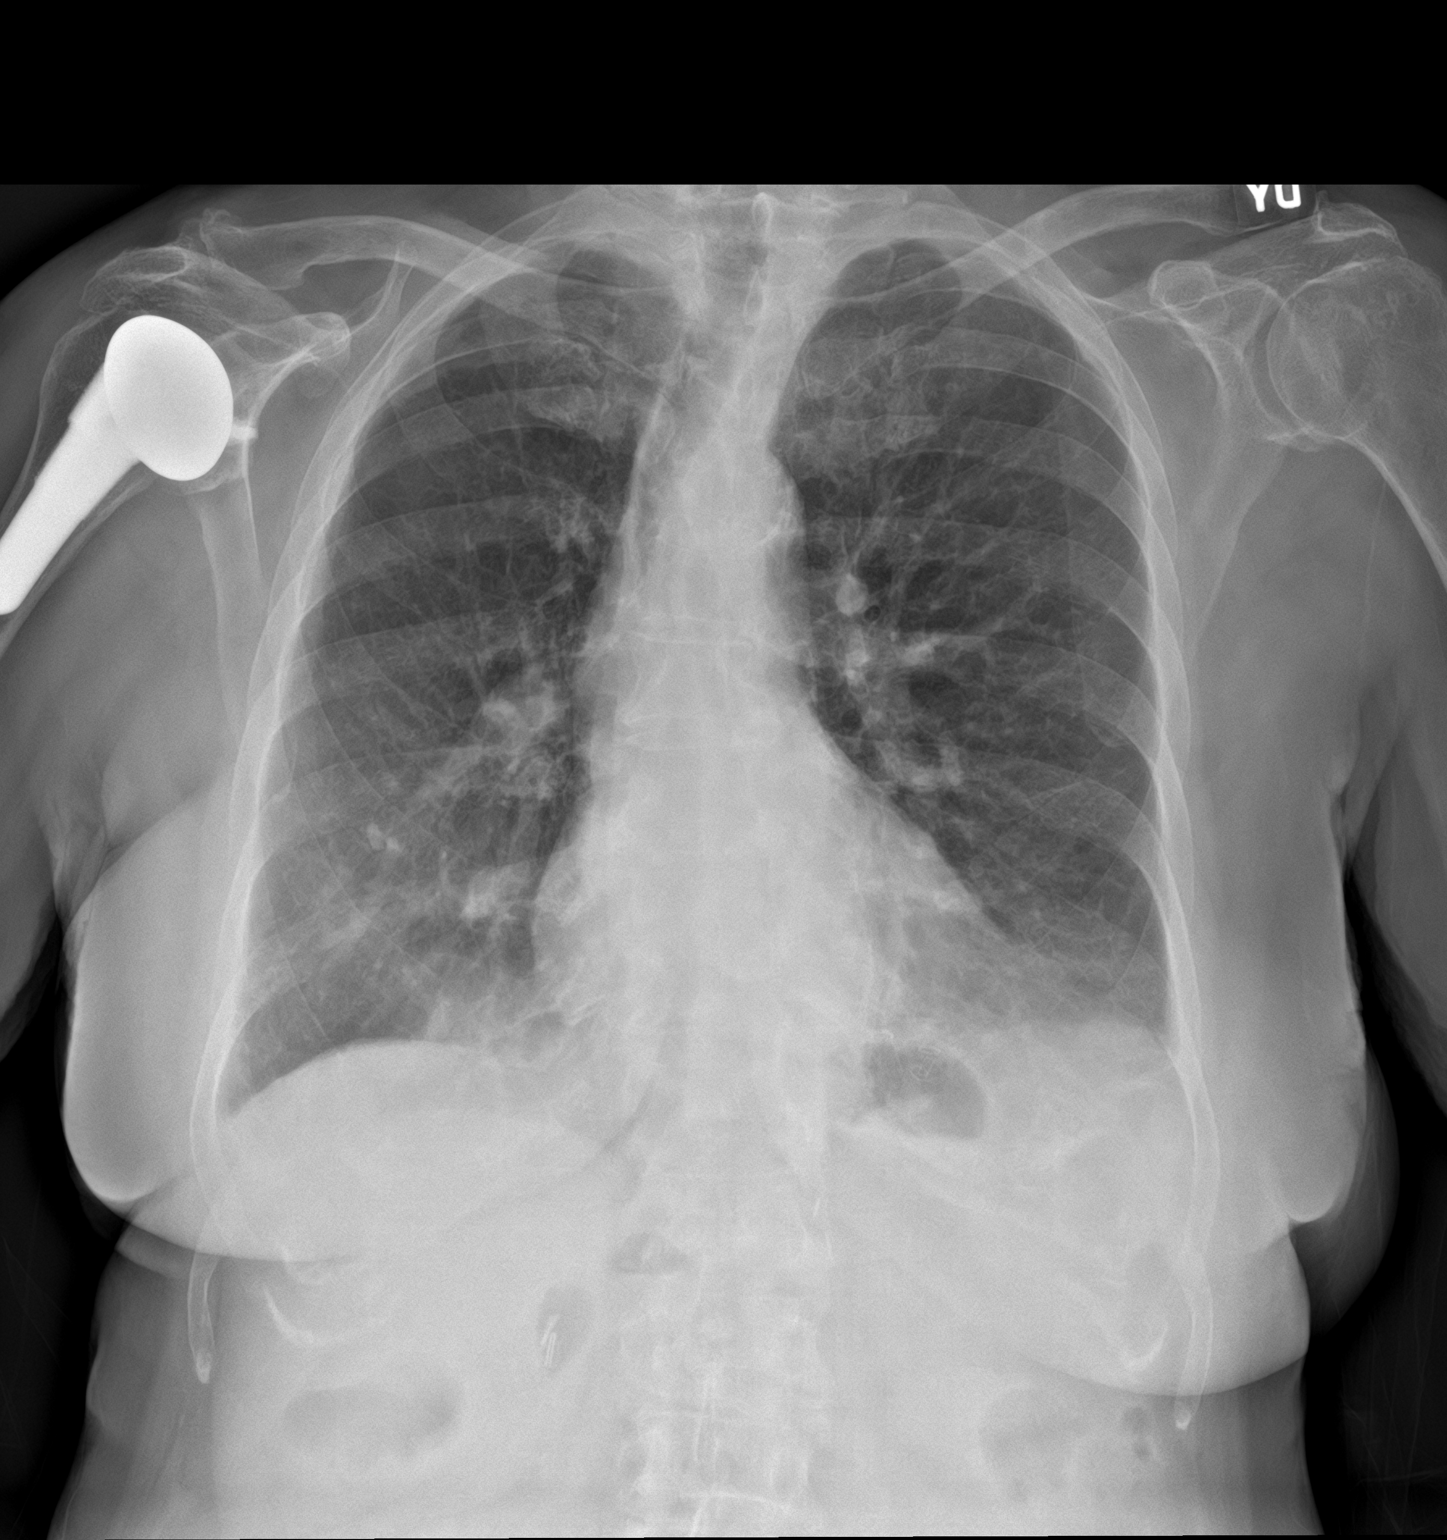

[chest lat]
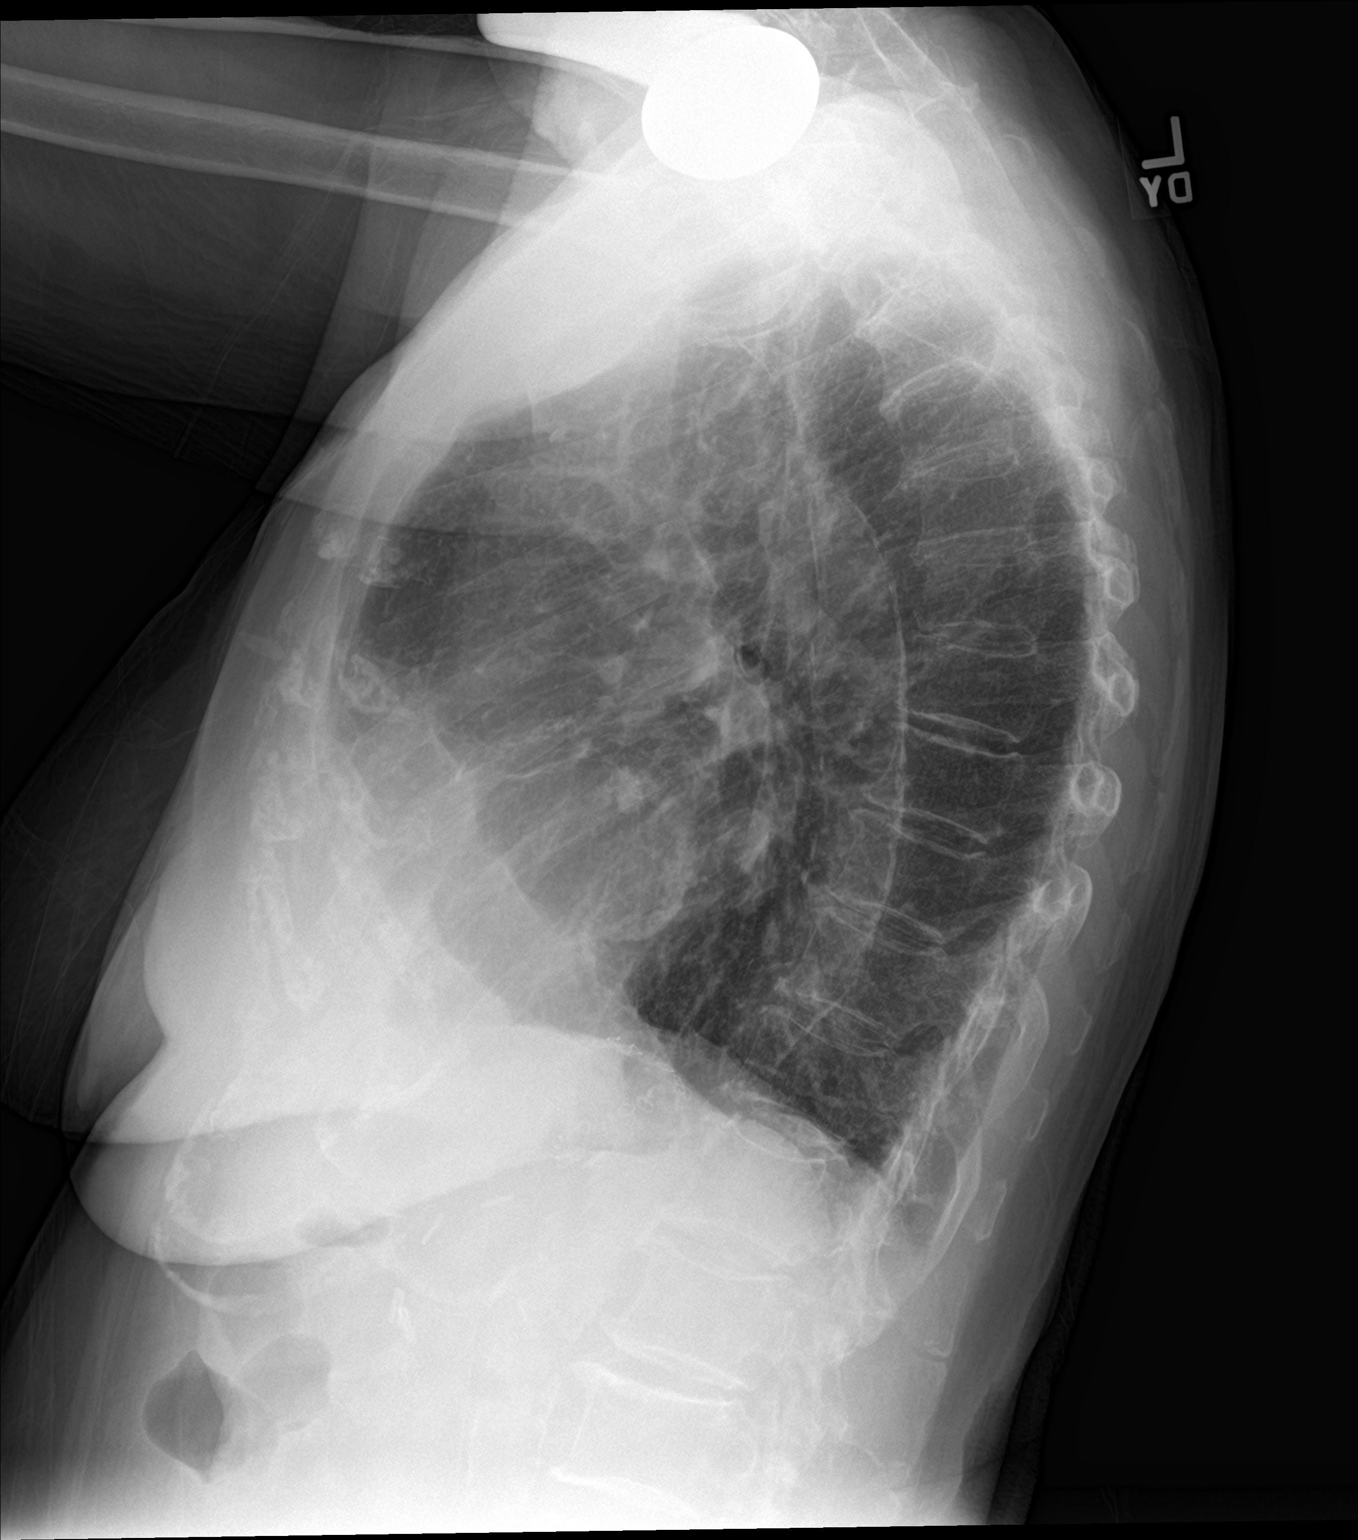

[2 of 2 positions shown; findings below may reference images not displayed]

FINDINGS: Normal sized heart. Stable scarring at the lung bases. Otherwise,
clear lungs. The lungs remain mildly hyperexpanded. Aortic
calcifications. Upper abdominal surgical clips. Stable right
shoulder prosthesis and mild scoliosis. Mild thoracic spine
degenerative changes. Diffuse osteopenia.
IMPRESSION: Stable mild changes of COPD. No acute abnormality.

## 2020-01-26 ENCOUNTER — Other Ambulatory Visit: Payer: Self-pay | Admitting: Gastroenterology

## 2020-01-26 ENCOUNTER — Other Ambulatory Visit (HOSPITAL_COMMUNITY): Payer: Self-pay | Admitting: Gastroenterology

## 2020-01-26 DIAGNOSIS — R1013 Epigastric pain: Secondary | ICD-10-CM

## 2021-05-22 ENCOUNTER — Ambulatory Visit: Payer: Self-pay | Admitting: Urology

## 2021-06-11 ENCOUNTER — Ambulatory Visit (INDEPENDENT_AMBULATORY_CARE_PROVIDER_SITE_OTHER): Payer: Medicare Other

## 2021-06-11 ENCOUNTER — Ambulatory Visit
Admission: EM | Admit: 2021-06-11 | Discharge: 2021-06-11 | Disposition: A | Discharge status: Home / Self Care | Payer: Medicare Other | Attending: Physician Assistant | Admitting: Physician Assistant

## 2021-06-11 ENCOUNTER — Other Ambulatory Visit: Payer: Self-pay

## 2021-06-11 DIAGNOSIS — J441 Chronic obstructive pulmonary disease with (acute) exacerbation: Secondary | ICD-10-CM

## 2021-06-11 DIAGNOSIS — R051 Acute cough: Secondary | ICD-10-CM | POA: Diagnosis not present

## 2021-06-11 DIAGNOSIS — R059 Cough, unspecified: Secondary | ICD-10-CM

## 2021-06-11 MED ORDER — BENZONATATE 100 MG PO CAPS: 100.0000 mg | ORAL_CAPSULE | Freq: Three times a day (TID) | ORAL | 0 refills | Status: AC

## 2021-06-11 MED ORDER — PREDNISONE 10 MG PO TABS: 20.0000 mg | ORAL_TABLET | Freq: Every day | ORAL | 0 refills | Status: AC

## 2021-06-11 MED ORDER — DOXYCYCLINE HYCLATE 100 MG PO CAPS: 100.0000 mg | ORAL_CAPSULE | Freq: Two times a day (BID) | ORAL | 0 refills | Status: AC

## 2021-06-11 NOTE — ED Triage Notes (Signed)
Pt present cough with chest congestion, symptoms started two weeks ago. Pt took otc medication with no relief.  ?

## 2021-06-11 NOTE — Discharge Instructions (Signed)
Your x-ray did not show any evidence of pneumonia which is great news.  I believe that your COPD is flared.  Continue your medications as previously prescribed.  Start doxycycline 100 mg twice daily.  This can upset your stomach so take it with food.  Start prednisone 20 mg in the morning for 4 days.  Do not take NSAIDs including aspirin, ibuprofen/Advil, naproxen/Aleve with this medication.  You can use Mucinex, Tylenol, Flonase for additional symptom relief.  Make sure you rest and drink plenty of fluid.  I would like your primary care to see you within 24 to 48 hours.  Please call to schedule an appointment.  If they have difficulty seeing you in this timeframe please return here.  If anything worsens and you have worsening cough, shortness of breath, chest pain, nausea/vomiting you need to go to the emergency room. ?

## 2021-06-11 NOTE — ED Provider Notes (Signed)
?[facility] ? ? ? ?CSN: [id number] ?Arrival date & time: 06/11/21  1303 ? ? ?  ? ?History   ?Chief Complaint ?Chief Complaint  ?Patient presents with  ? Cough  ? ? ?HPI ?Yolanda Taylor is a 88 y.o. female.  ? ?Patient presents today accompanied by her daughter help provide the majority of history.  Reports a 2-week history of cough that has worsened in the past week.  Reports productive cough with thick sputum.  She reports some associated shortness of breath as well as nasal congestion.  Denies any fever, nausea, vomiting, diarrhea, chest pain.  She has been using Zyrtec and over-the-counter cough medicine without improvement of symptoms.  Denies any known sick contacts.  She does have a history of COPD but denies any recent antibiotic use.  She has a history of prediabetes but denies history of diabetes.  She is a former smoker who quit many years ago.  She has had COVID approximately 2 years ago.  She did not take a COVID test when she first had symptoms but is confident that this is not COVID. ? ? ?Past Medical History:  ?Diagnosis Date  ? Anxiety   ? unspecified  ? Arthritis   ? Borderline diabetes   ? Breast cancer ([place]) 2006  ? right breast, radiation  ? Cancer [facility])   ? COPD (chronic obstructive pulmonary disease) ([place])   ? Emphysema lung ([place])   ? Essential hypertension   ? Gastroparesis   ? GERD (gastroesophageal reflux disease)   ? Hemorrhoid   ? History of anesthesia reaction   ? pain with procedure 03/08/2014; very sedated after surgery  ? History of hiatal hernia   ? History of kidney stones   ? Hypertension   ? Nephrolithiasis   ? Plantar fascial fibromatosis   ? PONV (postoperative nausea and vomiting)   ? unspecified; after surgery 03/08/2014  ? Pre-diabetes   ? Pure hypercholesterolemia   ? TIA (transient ischemic attack)   ? Vertigo   ? Vitamin D deficiency, unspecified   ? ? ?Patient Active Problem List  ? Diagnosis Date Noted  ? Primary osteoarthritis of right shoulder 02/07/2017   ? Status post total shoulder arthroplasty, right 01/10/2017  ? Femoral condyle fracture ([facility]) 10/11/2014  ? ? ?Past Surgical History:  ?Procedure Laterality Date  ? ABDOMINAL HYSTERECTOMY    ? ANTERIOR FUSION CERVICAL SPINE    ? BREAST EXCISIONAL BIOPSY Left 2001  ? neg  ? BREAST EXCISIONAL BIOPSY Right 2006  ? positive, lumpectomy  ? BREAST SURGERY    ? CHOLECYSTECTOMY    ? COLONOSCOPY    ? 04/04/1998; 08/23/2000; 08/19/2003; 02/24/2009; 12/18/2012  ? COLONOSCOPY    ? ESOPHAGOGASTRODUODENOSCOPY (EGD) WITH PROPOFOL N/A 04/11/2017  ? Procedure: ESOPHAGOGASTRODUODENOSCOPY (EGD) WITH PROPOFOL;  Surgeon: Lollie Sails, MD;  Location[facility] ENDOSCOPY;  Service: Endoscopy;  Laterality: N/A;  ? ESOPHAGOGASTRODUODENOSCOPY (EGD) WITH PROPOFOL N/A 05/03/2017  ? Procedure: ESOPHAGOGASTRODUODENOSCOPY (EGD) WITH PROPOFOL;  Surgeon: Lollie Sails, MD;  Location: [facility] ENDOSCOPY;  Service: Endoscopy;  Laterality: N/A;  ? EYE SURGERY Right 03/02/2014  ? Procedure: REMOVAL OF FOREIGN BODY, INTRAOCULAR; FROM POSTERIOR SEGMENT, NONMAGNETIC EXTRACTION (IOL); Surgeon: Dorna Mai, MD; Location: [place]; Service: Ophthalmology; Laterality: Right;  ? HERNIA REPAIR    ? HIATAL HERNIA REPAIR  2014  ? Laproscopic hiatal hernia repair with fundoplication ; Dr. Tamala Julian  ? HIP FRACTURE SURGERY Left 09/2014  ? INTRAMEDULLARY (IM) NAIL INTERTROCHANTERIC Left 10/11/2014  ? Procedure: intermedullary  nailing of left subtrocanteric  femur  fracture;  Surgeon: Earnestine Leys, MD;  Location: [facility] ORS;  Service: Orthopedics;  Laterality: Left;  ? INTRAOCULAR LENS EXCHANGE Right 03/29/2014  ? Procedure: EXCHANGE INTRAOCULAR LENS; Surgeon: Ross Ludwig, MD; Location: [place]; Service: Ophthalmology; Laterality: Right;  ? NISSEN FUNDOPLICATION    ? POLYPECTOMY  2000  ? REPOSITIONING INTRAOCULAR LENS Right 03/02/2014  ? Procedure: REPOSITIONING INTRAOCULAR LENS; Surgeon: Ross Ludwig, MD; Location: [place]; Service:  Ophthalmology; Laterality: Right;  ? REPOSITIONING INTRAOCULAR LENS Right 03/08/2014  ? Procedure: REPOSITIONING INTRAOCULAR LENS; Surgeon: Ross Ludwig, MD; Location: [place]; Service: Ophthalmology; Laterality: Right; pt had surgery yesterday needs to have lens repositioned come from a long way away NAME ALERT  ? SHOULDER OPEN ROTATOR CUFF REPAIR Right 01/10/2017  ? Procedure: ROTATOR CUFF REPAIR SHOULDER OPEN;  Surgeon: Corky Mull, MD;  Location: [facility] ORS;  Service: Orthopedics;  Laterality: Right;  ? TOTAL ABDOMINAL HYSTERECTOMY W/ BILATERAL SALPINGOOPHORECTOMY  1976  ? TOTAL SHOULDER ARTHROPLASTY Right 01/10/2017  ? Procedure: TOTAL SHOULDER ARTHROPLASTY;  Surgeon: Corky Mull, MD;  Location: [facility] ORS;  Service: Orthopedics;  Laterality: Right;  ? UPPER GASTROINTESTINAL ENDOSCOPY  04/11/2012  ? UPPER GASTROINTESTINAL ENDOSCOPY  07/25/2012  ? UPPER GI ENDOSCOPY    ? ? ?OB History   ?No obstetric history on file. ?  ? ? ? ?Home Medications   ? ?Prior to Admission medications   ?Medication Sig Start Date End Date Taking? Authorizing Provider  ?benzonatate (TESSALON) 100 MG capsule Take 1 capsule (100 mg total) by mouth every 8 (eight) hours. 06/11/21  Yes Sundeep Destin, Junie Panning K, PA-C  ?doxycycline (VIBRAMYCIN) 100 MG capsule Take 1 capsule (100 mg total) by mouth 2 (two) times daily. 06/11/21  Yes Treesa Mccully, Junie Panning K, PA-C  ?predniSONE (DELTASONE) 10 MG tablet Take 2 tablets (20 mg total) by mouth daily for 4 days. 06/11/21 06/15/21 Yes Gerrad Welker, [place] Skill, PA-C  ?acetaminophen (TYLENOL) 500 MG tablet Take 500-1,000 mg by mouth every 6 (six) hours as needed for moderate pain or headache.     [provider]  ?albuterol (PROVENTIL HFA;VENTOLIN HFA) 108 (90 Base) MCG/ACT inhaler Inhale 2 puffs into the lungs every 4 (four) hours as needed for wheezing or shortness of breath. 10/26/17   Merlyn Lot, MD  ?amLODipine (NORVASC) 2.5 MG tablet Take 2.5 mg by mouth daily.     [provider]   ?calcium-vitamin D (OSCAL WITH D) 500-200 MG-UNIT tablet Take 2 tablets by mouth daily with breakfast.    [provider]  ?diazepam (VALIUM) 5 MG tablet Take 1 tablet (5 mg total) by mouth every 8 (eight) hours as needed for anxiety or muscle spasms. 10/14/14   Epifanio Lesches, MD  ?docusate sodium (COLACE) 100 MG capsule Take 1 tablet once or twice daily as needed for constipation while taking narcotic pain medicine 11/14/17   Hinda Kehr, MD  ?enoxaparin (LOVENOX) 40 MG/0.4ML injection Inject 0.4 mLs (40 mg total) into the skin daily. ?Patient not taking: Reported on 05/03/2017 01/11/17   Watt Climes, PA  ?ferrous sulfate 325 (65 FE) MG tablet Take 1 tablet (325 mg total) by mouth 2 (two) times daily with a meal. 10/14/14   Epifanio Lesches, MD  ?Fluticasone-Salmeterol (ADVAIR) 250-50 MCG/DOSE AEPB Inhale 1 puff into the lungs 2 (two) times daily. 10/26/17   Merlyn Lot, MD  ?hydrochlorothiazide (HYDRODIURIL) 25 MG tablet Take 25 mg by mouth daily.  09/27/14   [provider]  ?  HYDROcodone-acetaminophen (NORCO/VICODIN) 5-325 MG tablet Take 1-2 tablets by mouth every 4 (four) hours as needed for moderate pain. 11/14/17   Hinda Kehr, MD  ?loratadine (CLARITIN) 10 MG tablet Take 10 mg by mouth daily as needed for allergies.    [provider]  ?meclizine (ANTIVERT) 25 MG tablet Take 25 mg by mouth 3 (three) times daily as needed for dizziness.    [provider]  ?metoCLOPramide (REGLAN) 5 MG tablet Take 5 mg by mouth 4 (four) times daily -  before meals and at bedtime.     [provider]  ?oxyCODONE 10 MG TABS Take 1 tablet (10 mg total) by mouth every 3 (three) hours as needed for severe pain ((score 7 to 10)). ?Patient not taking: Reported on 05/03/2017 01/11/17   Watt Climes, PA  ?pantoprazole (PROTONIX) 40 MG tablet Take 40 mg by mouth daily.  09/10/14   [provider]  ?Polyethyl Glycol-Propyl Glycol (SYSTANE) 0.4-0.3 % SOLN Place 1 drop  into both eyes as needed.    [provider]  ?potassium chloride SA (KLOR-CON M20) 20 MEQ tablet Take 1 tablet (20 mEq total) by mouth daily. 11/14/17   Hinda Kehr, MD  ?sucralfate (CARAFATE) 1 g tablet Take 1

## 2023-06-07 ENCOUNTER — Other Ambulatory Visit: Payer: Self-pay | Admitting: Family Medicine

## 2023-06-07 DIAGNOSIS — N644 Mastodynia: Secondary | ICD-10-CM

## 2023-06-14 ENCOUNTER — Other Ambulatory Visit: Payer: Self-pay | Admitting: Student

## 2023-06-14 ENCOUNTER — Ambulatory Visit
Admission: RE | Admit: 2023-06-14 | Discharge: 2023-06-14 | Disposition: A | Discharge status: Home / Self Care | Source: Ambulatory Visit | Attending: Student | Admitting: Student

## 2023-06-14 DIAGNOSIS — M5416 Radiculopathy, lumbar region: Secondary | ICD-10-CM | POA: Insufficient documentation

## 2023-06-14 DIAGNOSIS — Z9181 History of falling: Secondary | ICD-10-CM

## 2023-06-14 DIAGNOSIS — M8448XA Pathological fracture, other site, initial encounter for fracture: Secondary | ICD-10-CM | POA: Diagnosis present

## 2023-06-21 ENCOUNTER — Other Ambulatory Visit: Payer: Self-pay | Admitting: Student

## 2023-06-21 DIAGNOSIS — M8448XA Pathological fracture, other site, initial encounter for fracture: Secondary | ICD-10-CM
